# Patient Record
Sex: Female | Born: 1965 | Race: Black or African American | Hispanic: No | Marital: Married | Smoking: Never smoker
Health system: Southern US, Community
[De-identification: ages and names within clinical notes are randomized; demographics above are authoritative.]

## PROBLEM LIST (undated history)

## (undated) DIAGNOSIS — M1990 Unspecified osteoarthritis, unspecified site: Secondary | ICD-10-CM

## (undated) DIAGNOSIS — T304 Corrosion of unspecified body region, unspecified degree: Secondary | ICD-10-CM

## (undated) DIAGNOSIS — G4700 Insomnia, unspecified: Secondary | ICD-10-CM

## (undated) DIAGNOSIS — T25322A Burn of third degree of left foot, initial encounter: Secondary | ICD-10-CM

## (undated) DIAGNOSIS — I10 Essential (primary) hypertension: Secondary | ICD-10-CM

## (undated) HISTORY — DX: Insomnia, unspecified: G47.00

---

## 2006-10-06 ENCOUNTER — Emergency Department (HOSPITAL_COMMUNITY): Admission: EM | Admit: 2006-10-06 | Discharge: 2006-10-06 | Payer: Self-pay | Admitting: Emergency Medicine

## 2008-09-29 IMAGING — CR DG CHEST 1V PORT
1 series · 1 of 1 positions shown · non-contrast
Comparison: none

CLINICAL DATA: Chest pain. 
 PORTABLE CHEST - 1 VIEW - 10/06/06: 
 Taken at 2266 hours.

[view not recorded]
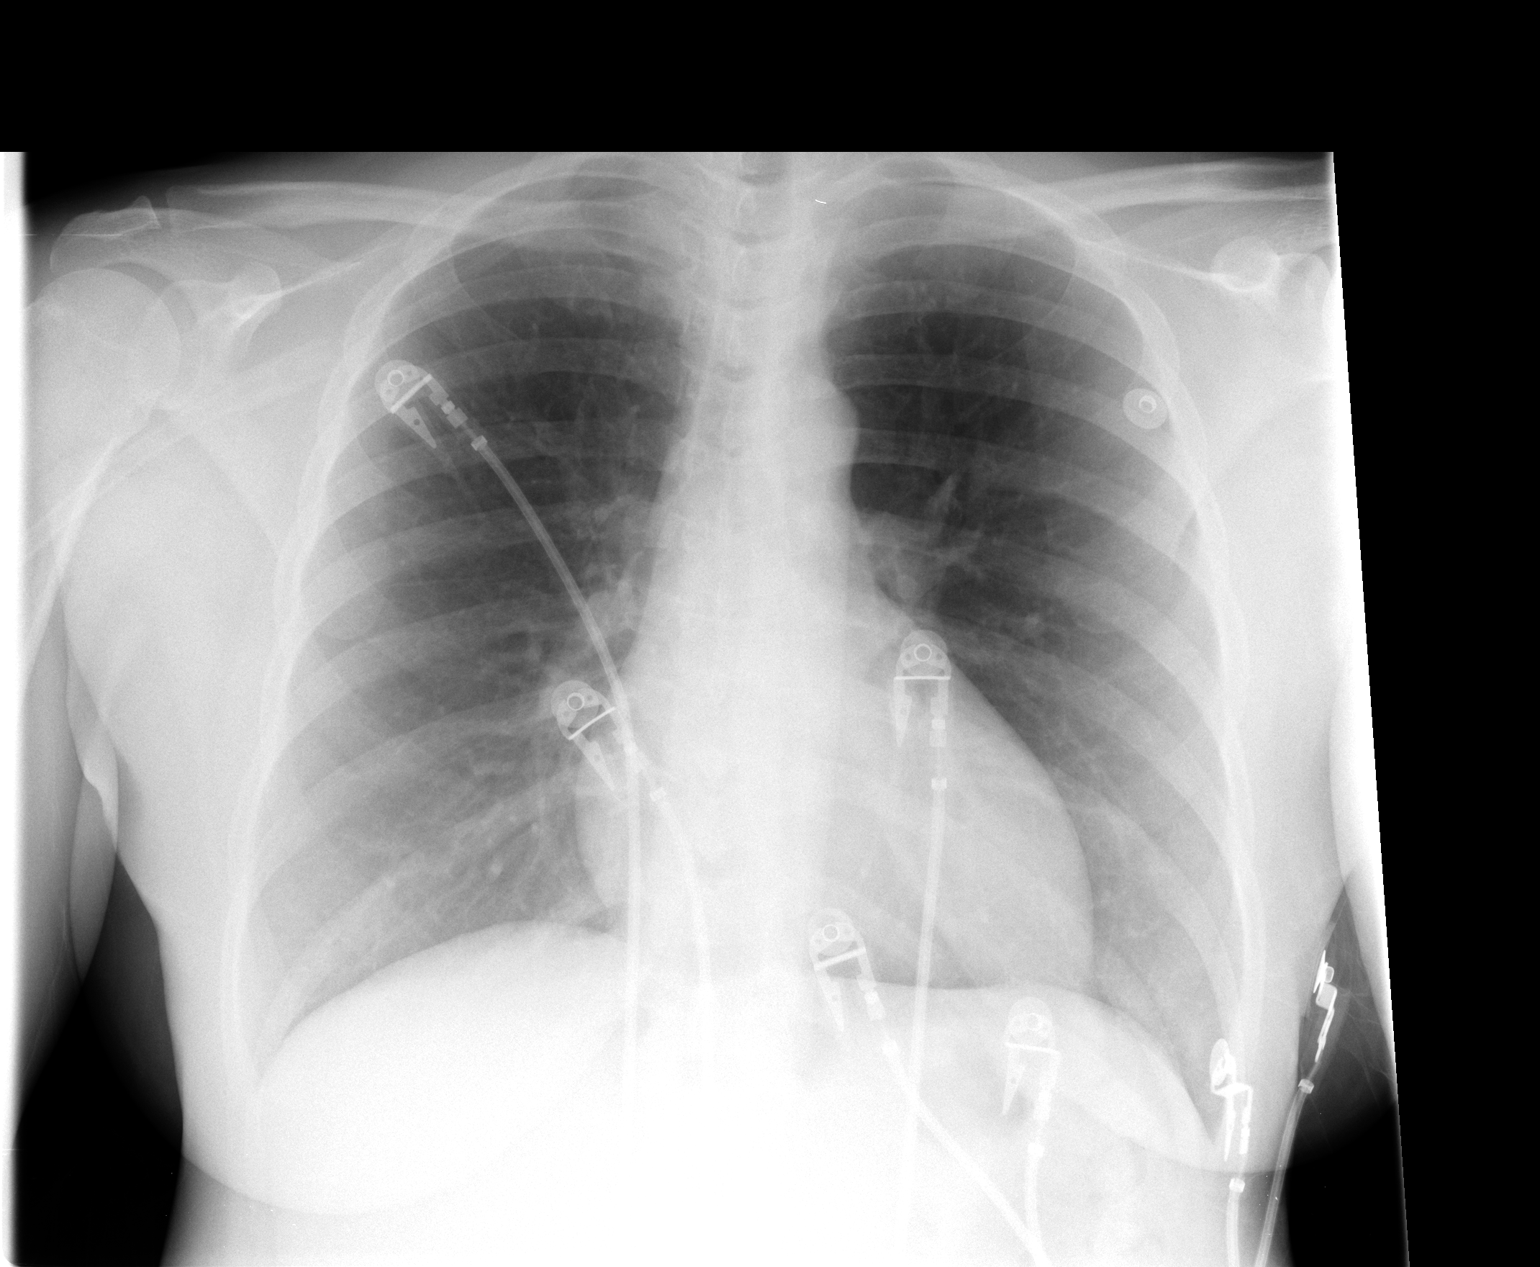

[1 of 1 positions shown; findings below may reference images not displayed]

FINDINGS: The lungs are clear. The heart is upper normal in size for a portable study.  There is no evidence mediastinal or hilar adenopathy.
IMPRESSION: No evidence for active chest disease.

## 2008-12-02 ENCOUNTER — Encounter: Admission: RE | Admit: 2008-12-02 | Discharge: 2008-12-02 | Payer: Self-pay | Admitting: *Deleted

## 2010-10-25 LAB — I-STAT 8, (EC8 V) (CONVERTED LAB)
Acid-Base Excess: 2
Bicarbonate: 28.2 — ABNORMAL HIGH
Glucose, Bld: 96
TCO2: 30
pCO2, Ven: 48.1
pH, Ven: 7.375 — ABNORMAL HIGH

## 2010-10-25 LAB — COMPREHENSIVE METABOLIC PANEL
ALT: 24
Calcium: 8.4
Creatinine, Ser: 0.8
Glucose, Bld: 92
Sodium: 134 — ABNORMAL LOW
Total Protein: 6.5

## 2010-10-25 LAB — DIFFERENTIAL
Eosinophils Absolute: 0.2
Lymphocytes Relative: 28
Lymphs Abs: 2.6
Monocytes Relative: 10
Neutro Abs: 5.3
Neutrophils Relative %: 59

## 2010-10-25 LAB — POCT CARDIAC MARKERS
CKMB, poc: <1 — ABNORMAL LOW
CKMB, poc: <1 — ABNORMAL LOW
Myoglobin, poc: 59.5
Troponin i, poc: <0.05
Troponin i, poc: <0.05

## 2010-10-25 LAB — CBC
Hemoglobin: 10.6 — ABNORMAL LOW
MCHC: 33.1
MCV: 76.4 — ABNORMAL LOW
RDW: 17.4 — ABNORMAL HIGH

## 2010-10-25 LAB — URINALYSIS, ROUTINE W REFLEX MICROSCOPIC
Bilirubin Urine: NEGATIVE
Ketones, ur: NEGATIVE
Nitrite: NEGATIVE
Urobilinogen, UA: 1
pH: 7.5

## 2010-10-25 LAB — POCT I-STAT CREATININE
Creatinine, Ser: 1
Operator id: 277751

## 2011-03-11 DIAGNOSIS — I10 Essential (primary) hypertension: Secondary | ICD-10-CM | POA: Insufficient documentation

## 2011-12-15 ENCOUNTER — Encounter (HOSPITAL_COMMUNITY): Payer: Self-pay | Admitting: Emergency Medicine

## 2011-12-15 ENCOUNTER — Emergency Department (HOSPITAL_COMMUNITY)
Admission: EM | Admit: 2011-12-15 | Discharge: 2011-12-15 | Disposition: A | Discharge status: Home / Self Care | Payer: BC Managed Care – PPO | Attending: Emergency Medicine | Admitting: Emergency Medicine

## 2011-12-15 DIAGNOSIS — Z79899 Other long term (current) drug therapy: Secondary | ICD-10-CM | POA: Insufficient documentation

## 2011-12-15 DIAGNOSIS — G8929 Other chronic pain: Secondary | ICD-10-CM

## 2011-12-15 DIAGNOSIS — I10 Essential (primary) hypertension: Secondary | ICD-10-CM | POA: Insufficient documentation

## 2011-12-15 DIAGNOSIS — M25569 Pain in unspecified knee: Secondary | ICD-10-CM | POA: Insufficient documentation

## 2011-12-15 HISTORY — DX: Essential (primary) hypertension: I10

## 2011-12-15 MED ORDER — HYDROMORPHONE HCL PF 1 MG/ML IJ SOLN
1.0000 mg | Freq: Once | INTRAMUSCULAR | Status: AC
  Administered 2011-12-15: 1 mg via INTRAMUSCULAR
  Filled 2011-12-15: qty 1

## 2011-12-15 MED ORDER — OXYCODONE-ACETAMINOPHEN 5-325 MG PO TABS: 1.0000 | ORAL_TABLET | ORAL | Status: DC | PRN

## 2011-12-15 MED ORDER — PREDNISONE 50 MG PO TABS
60.0000 mg | ORAL_TABLET | Freq: Once | ORAL | Status: AC
  Administered 2011-12-15: 60 mg via ORAL
  Filled 2011-12-15: qty 1

## 2011-12-15 NOTE — ED Notes (Signed)
Pt c/o left thigh pain that radiates to her lower back.

## 2011-12-16 NOTE — ED Provider Notes (Signed)
Medical screening examination/treatment/procedure(s) were performed by non-physician practitioner and as supervising physician I was immediately available for consultation/collaboration.   Britny Riel, MD 12/16/11 1821 

## 2011-12-16 NOTE — ED Provider Notes (Signed)
History     CSN: [id number]  Arrival date & time 12/15/11  1810   First MD Initiated Contact with Patient 12/15/11 1924      Chief Complaint  Patient presents with  . Leg Pain    (Consider location/radiation/quality/duration/timing/severity/associated sxs/prior treatment) HPI Comments: Carla Suarez presents with acute on chronic left knee and left thigh pain which has been present for the past several months, but has become worse today.  She was painting  Interior walls at her rental home when she develop worsening pain in the left knee,  Which radiates into her lateral upper thigh and hip area.  Pain is worse with movement and palpation and weight bearing.  She has seen her orthopedist Dr. Farris Has one month ago for this condition and she reports xrays of her knee and hip were normal.  She is anticipating an mri and possible steroid injections in her knee but this has not been arranged yet.  She denies new injury today but presents for pain relief and an MRI while she is awaiting re-evaluation by Dr Farris Has.  She currently uses voltaren without relief of pain.  She denies numbness, weakness and swelling in her distal leg and foot.  The history is provided by the patient.    Past Medical History  Diagnosis Date  . Hypertension     History reviewed. No pertinent past surgical history.  History reviewed. No pertinent family history.  History  Substance Use Topics  . Smoking status: Never Smoker   . Smokeless tobacco: Not on file  . Alcohol Use: No    OB History    Grav Para Term Preterm Abortions TAB SAB Ect Mult Living                  Review of Systems  Musculoskeletal: Positive for arthralgias. Negative for back pain and joint swelling.  Skin: Negative for wound.  Neurological: Negative for weakness and numbness.    Allergies  Oregano  Home Medications   Current Outpatient Rx  Name  Route  Sig  Dispense  Refill  . AMLODIPINE BESYLATE 10 MG PO TABS   Oral  Take 10 mg by mouth daily.         Marland Kitchen DICLOFENAC SODIUM 75 MG PO TBEC   Oral   Take 75 mg by mouth 2 (two) times daily.         . OXYCODONE-ACETAMINOPHEN 5-325 MG PO TABS   Oral   Take 1 tablet by mouth every 4 (four) hours as needed for pain.   6 tablet   0     BP 169/99  Pulse 75  Temp 98 F (36.7 C) (Oral)  Resp 18  SpO2 98%  Physical Exam  Constitutional: She appears well-developed and well-nourished.  HENT:  Head: Atraumatic.  Neck: Normal range of motion.  Cardiovascular: Intact distal pulses.        Pulses equal bilaterally  Musculoskeletal: She exhibits tenderness. She exhibits no edema.       Left hip: She exhibits normal range of motion, no bony tenderness, no swelling and no deformity.       Left knee: She exhibits normal range of motion, no swelling, no effusion and normal meniscus. tenderness found. Medial joint line and lateral joint line tenderness noted.       Left thigh is soft, anterior tender to palpation, no mass, no muscle spasm, ecchymosis, rash, no edema or visible trauma.   Neurological: She is alert. She has normal strength. She  displays normal reflexes. No sensory deficit.       Equal strength  Skin: Skin is warm and dry. No rash noted.       Skin of left knee and thigh normal,  No erythema, rash, crepitus, no edema.  Psychiatric: She has a normal mood and affect.    ED Course  Procedures (including critical care time)  Labs Reviewed - No data to display No results found.   1. Chronic knee pain       MDM  Explained to pt that ed does not have MRI available at this time.  She was given dilaudid 1 mg IM with moderate improvement in pain.  Exam does not elicit source of pain,  She does have tenderness over her left trochanteric bursa,  But no worse than palpation along her entire left thigh and knee.  Distal pulses intact,  No edema,  With patient reported normal xrays from 1 month ago and no new injury,  Plain xrays deferred.   Also  discussed obtaining bloodwork today as she describes fear of bone cancer.  She states her blood tests last moth by Dr. Farris Has were normal.  Deferred repeat bloodwork today. Chronic nature of pt's complaints make infection unlikely.   Prescribed oxycodone.  Pt plans to see Dr Farris Has tomorrow at walk in clinic for further management.        Burgess Amor, PA 12/16/11 1356  Burgess Amor, [place] 12/16/11 1356

## 2011-12-17 MED FILL — Oxycodone w/ Acetaminophen Tab 5-325 MG: ORAL | Qty: 6 | Status: AC

## 2012-05-11 ENCOUNTER — Emergency Department (HOSPITAL_COMMUNITY)
Admission: EM | Admit: 2012-05-11 | Discharge: 2012-05-11 | Disposition: A | Discharge status: Home / Self Care | Payer: BC Managed Care – PPO | Attending: Emergency Medicine | Admitting: Emergency Medicine

## 2012-05-11 ENCOUNTER — Emergency Department (HOSPITAL_COMMUNITY): Payer: BC Managed Care – PPO

## 2012-05-11 ENCOUNTER — Encounter (HOSPITAL_COMMUNITY): Payer: Self-pay | Admitting: *Deleted

## 2012-05-11 DIAGNOSIS — Z79899 Other long term (current) drug therapy: Secondary | ICD-10-CM | POA: Insufficient documentation

## 2012-05-11 DIAGNOSIS — Y92410 Unspecified street and highway as the place of occurrence of the external cause: Secondary | ICD-10-CM | POA: Insufficient documentation

## 2012-05-11 DIAGNOSIS — S20211A Contusion of right front wall of thorax, initial encounter: Secondary | ICD-10-CM

## 2012-05-11 DIAGNOSIS — S20219A Contusion of unspecified front wall of thorax, initial encounter: Secondary | ICD-10-CM | POA: Insufficient documentation

## 2012-05-11 DIAGNOSIS — T07XXXA Unspecified multiple injuries, initial encounter: Secondary | ICD-10-CM | POA: Insufficient documentation

## 2012-05-11 DIAGNOSIS — I10 Essential (primary) hypertension: Secondary | ICD-10-CM | POA: Insufficient documentation

## 2012-05-11 DIAGNOSIS — Y9389 Activity, other specified: Secondary | ICD-10-CM | POA: Insufficient documentation

## 2012-05-11 MED ORDER — HYDROCODONE-ACETAMINOPHEN 5-325 MG PO TABS: 1.0000 | ORAL_TABLET | ORAL | Status: DC | PRN

## 2012-05-11 MED ORDER — HYDROCODONE-ACETAMINOPHEN 5-325 MG PO TABS
1.0000 | ORAL_TABLET | Freq: Once | ORAL | Status: AC
  Administered 2012-05-11: 1 via ORAL
  Filled 2012-05-11: qty 1

## 2012-05-11 NOTE — ED Provider Notes (Signed)
Medical screening examination/treatment/procedure(s) were performed by non-physician practitioner and as supervising physician I was immediately available for consultation/collaboration.   Charles B. Sheldon, MD 05/11/12 2259 
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
## 2014-07-11 ENCOUNTER — Encounter (INDEPENDENT_AMBULATORY_CARE_PROVIDER_SITE_OTHER): Payer: Self-pay | Admitting: *Deleted

## 2014-11-03 ENCOUNTER — Encounter (INDEPENDENT_AMBULATORY_CARE_PROVIDER_SITE_OTHER): Payer: Self-pay | Admitting: *Deleted

## 2014-11-03 ENCOUNTER — Other Ambulatory Visit (INDEPENDENT_AMBULATORY_CARE_PROVIDER_SITE_OTHER): Payer: Self-pay | Admitting: *Deleted

## 2014-11-03 DIAGNOSIS — Z1211 Encounter for screening for malignant neoplasm of colon: Secondary | ICD-10-CM

## 2014-12-21 ENCOUNTER — Telehealth (INDEPENDENT_AMBULATORY_CARE_PROVIDER_SITE_OTHER): Payer: Self-pay | Admitting: *Deleted

## 2014-12-21 DIAGNOSIS — Z1211 Encounter for screening for malignant neoplasm of colon: Secondary | ICD-10-CM

## 2014-12-21 MED ORDER — PEG 3350-KCL-NA BICARB-NACL 420 G PO SOLR: 4000.0000 mL | Freq: Once | ORAL | Status: DC

## 2014-12-21 NOTE — Telephone Encounter (Signed)
Patient needs trilyte 

## 2015-01-17 ENCOUNTER — Telehealth (INDEPENDENT_AMBULATORY_CARE_PROVIDER_SITE_OTHER): Payer: Self-pay | Admitting: *Deleted

## 2015-01-17 NOTE — Telephone Encounter (Signed)
agree

## 2015-01-17 NOTE — Telephone Encounter (Signed)
Referring MD/PCP: howell   Procedure: tcs  Reason/Indication:  screening  Has patient had this procedure before?  yes  If so, when, by whom and where?    Is there a family history of colon cancer?  no  Who?  What age when diagnosed?    Is patient diabetic?   no      Does patient have prosthetic heart valve or mechanical valve?  no  Do you have a pacemaker?  no  Has patient ever had endocarditis? no  Has patient had joint replacement within last 12 months?  no  Does patient tend to be constipated or take laxatives? no  Does patient have a history of alcohol/drug use?  no  Is patient on Coumadin, Plavix and/or Aspirin? no  Medications: amlodipine 10/5 mg daily, otc arthritis 600 mg prn  Allergies: nkda  Medication Adjustment:   Procedure date & time: 1/18/7 at 830

## 2015-02-01 ENCOUNTER — Encounter (HOSPITAL_COMMUNITY): Payer: Self-pay | Admitting: *Deleted

## 2015-02-01 ENCOUNTER — Encounter (HOSPITAL_COMMUNITY)
Admission: RE | Disposition: A | Discharge status: Home / Self Care | Payer: Self-pay | Source: Ambulatory Visit | Attending: Internal Medicine

## 2015-02-01 ENCOUNTER — Ambulatory Visit (HOSPITAL_COMMUNITY)
Admission: RE | Admit: 2015-02-01 | Discharge: 2015-02-01 | Disposition: A | Discharge status: Home / Self Care | Payer: TRICARE For Life (TFL) | Source: Ambulatory Visit | Attending: Internal Medicine | Admitting: Internal Medicine

## 2015-02-01 DIAGNOSIS — Z1211 Encounter for screening for malignant neoplasm of colon: Secondary | ICD-10-CM | POA: Diagnosis not present

## 2015-02-01 DIAGNOSIS — I10 Essential (primary) hypertension: Secondary | ICD-10-CM | POA: Diagnosis not present

## 2015-02-01 DIAGNOSIS — Z79899 Other long term (current) drug therapy: Secondary | ICD-10-CM | POA: Diagnosis not present

## 2015-02-01 DIAGNOSIS — K644 Residual hemorrhoidal skin tags: Secondary | ICD-10-CM | POA: Insufficient documentation

## 2015-02-01 DIAGNOSIS — K648 Other hemorrhoids: Secondary | ICD-10-CM | POA: Diagnosis not present

## 2015-02-01 DIAGNOSIS — K5730 Diverticulosis of large intestine without perforation or abscess without bleeding: Secondary | ICD-10-CM | POA: Insufficient documentation

## 2015-02-01 HISTORY — PX: COLONOSCOPY: SHX5424

## 2015-02-01 SURGERY — COLONOSCOPY
Anesthesia: Moderate Sedation

## 2015-02-01 MED ORDER — MEPERIDINE HCL 50 MG/ML IJ SOLN
INTRAMUSCULAR | Status: AC
  Filled 2015-02-01: qty 1

## 2015-02-01 MED ORDER — MEPERIDINE HCL 50 MG/ML IJ SOLN
INTRAMUSCULAR | Status: DC | PRN
  Administered 2015-02-01 (×2): 25 mg via INTRAVENOUS

## 2015-02-01 MED ORDER — MIDAZOLAM HCL 5 MG/5ML IJ SOLN
INTRAMUSCULAR | Status: AC
  Filled 2015-02-01: qty 10

## 2015-02-01 MED ORDER — SODIUM CHLORIDE 0.9 % IV SOLN
INTRAVENOUS | Status: DC
  Administered 2015-02-01: 08:00:00 via INTRAVENOUS

## 2015-02-01 MED ORDER — STERILE WATER FOR IRRIGATION IR SOLN
Status: DC | PRN
  Administered 2015-02-01: 09:00:00

## 2015-02-01 MED ORDER — MIDAZOLAM HCL 5 MG/5ML IJ SOLN
INTRAMUSCULAR | Status: DC | PRN
  Administered 2015-02-01 (×2): 2 mg via INTRAVENOUS
  Administered 2015-02-01: 1 mg via INTRAVENOUS

## 2015-02-01 NOTE — Discharge Instructions (Signed)
Resume usual medications and diet  No driving for 24 hours  Next screening exam in 10 years     Colonoscopy, Care After These instructions give you information on caring for yourself after your procedure. Your doctor may also give you more specific instructions. Call your doctor if you have any problems or questions after your procedure. HOME CARE  Do not drive for 24 hours.  Do not sign important papers or use machinery for 24 hours.  You may shower.  You may go back to your usual activities, but go slower for the first 24 hours.  Take rest breaks often during the first 24 hours.  Walk around or use warm packs on your belly (abdomen) if you have belly cramping or gas.  Drink enough fluids to keep your pee (urine) clear or pale yellow.  Resume your normal diet. Avoid heavy or fried foods.  Avoid drinking alcohol for 24 hours or as told by your doctor.  Only take medicines as told by your doctor. If a tissue sample (biopsy) was taken during the procedure:   Do not take aspirin or blood thinners for 7 days, or as told by your doctor.  Do not drink alcohol for 7 days, or as told by your doctor.  Eat soft foods for the first 24 hours. GET HELP IF: You still have a small amount of blood in your poop (stool) 2-3 days after the procedure. GET HELP RIGHT AWAY IF:  You have more than a small amount of blood in your poop.  You see clumps of tissue (blood clots) in your poop.  Your belly is puffy (swollen).  You feel sick to your stomach (nauseous) or throw up (vomit).  You have a fever.  You have belly pain that gets worse and medicine does not help. MAKE SURE YOU:  Understand these instructions.  Will watch your condition.  Will get help right away if you are not doing well or get worse.   This information is not intended to replace advice given to you by your health care provider. Make sure you discuss any questions you have with your health care provider.     Document Released: 02/02/2010 Document Revised: 01/05/2013 Document Reviewed: 09/07/2012 Elsevier Interactive Patient Education 2016 [employer].   Hemorrhoids Hemorrhoids are swollen veins around the rectum or anus. There are two types of hemorrhoids:   Internal hemorrhoids. These occur in the veins just inside the rectum. They may poke through to the outside and become irritated and painful.  External hemorrhoids. These occur in the veins outside the anus and can be felt as a painful swelling or hard lump near the anus. CAUSES  Pregnancy.   Obesity.   Constipation or diarrhea.   Straining to have a bowel movement.   Sitting for long periods on the toilet.  Heavy lifting or other activity that caused you to strain.  Anal intercourse. SYMPTOMS   Pain.   Anal itching or irritation.   Rectal bleeding.   Fecal leakage.   Anal swelling.   One or more lumps around the anus.  DIAGNOSIS  Your caregiver may be able to diagnose hemorrhoids by visual examination. Other examinations or tests that may be performed include:   Examination of the rectal area with a gloved hand (digital rectal exam).   Examination of anal canal using a small tube (scope).   A blood test if you have lost a significant amount of blood.  A test to look inside the colon (sigmoidoscopy  or colonoscopy). TREATMENT Most hemorrhoids can be treated at home. However, if symptoms do not seem to be getting better or if you have a lot of rectal bleeding, your caregiver may perform a procedure to help make the hemorrhoids get smaller or remove them completely. Possible treatments include:   Placing a rubber band at the base of the hemorrhoid to cut off the circulation (rubber band ligation).   Injecting a chemical to shrink the hemorrhoid (sclerotherapy).   Using a tool to burn the hemorrhoid (infrared light therapy).   Surgically removing the hemorrhoid (hemorrhoidectomy).   Stapling  the hemorrhoid to block blood flow to the tissue (hemorrhoid stapling).  HOME CARE INSTRUCTIONS   Eat foods with fiber, such as whole grains, beans, nuts, fruits, and vegetables. Ask your doctor about taking products with added fiber in them (fibersupplements).  Increase fluid intake. Drink enough water and fluids to keep your urine clear or pale yellow.   Exercise regularly.   Go to the bathroom when you have the urge to have a bowel movement. Do not wait.   Avoid straining to have bowel movements.   Keep the anal area dry and clean. Use wet toilet paper or moist towelettes after a bowel movement.   Medicated creams and suppositories may be used or applied as directed.   Only take over-the-counter or prescription medicines as directed by your caregiver.   Take warm sitz baths for 15-20 minutes, 3-4 times a day to ease pain and discomfort.   Place ice packs on the hemorrhoids if they are tender and swollen. Using ice packs between sitz baths may be helpful.   Put ice in a plastic bag.   Place a towel between your skin and the bag.   Leave the ice on for 15-20 minutes, 3-4 times a day.   Do not use a donut-shaped pillow or sit on the toilet for long periods. This increases blood pooling and pain.  SEEK MEDICAL CARE IF:  You have increasing pain and swelling that is not controlled by treatment or medicine.  You have uncontrolled bleeding.  You have difficulty or you are unable to have a bowel movement.  You have pain or inflammation outside the area of the hemorrhoids. MAKE SURE YOU:  Understand these instructions.  Will watch your condition.  Will get help right away if you are not doing well or get worse.   This information is not intended to replace advice given to you by your health care provider. Make sure you discuss any questions you have with your health care provider.   Document Released: 12/29/1999 Document Revised: 12/18/2011 Document Reviewed:  11/05/2011 Elsevier Interactive Patient Education 2016 [employer].    Diverticulosis Diverticulosis is the condition that develops when small pouches (diverticula) form in the wall of your colon. Your colon, or large intestine, is where water is absorbed and stool is formed. The pouches form when the inside layer of your colon pushes through weak spots in the outer layers of your colon. CAUSES  No one knows exactly what causes diverticulosis. RISK FACTORS  Being older than 50. Your risk for this condition increases with age. Diverticulosis is rare in people younger than 40 years. By age 44, almost everyone has it.  Eating a low-fiber diet.  Being frequently constipated.  Being overweight.  Not getting enough exercise.  Smoking.  Taking over-the-counter pain medicines, like aspirin and ibuprofen. SYMPTOMS  Most people with diverticulosis do not have symptoms. DIAGNOSIS  Because diverticulosis often has  no symptoms, health care providers often discover the condition during an exam for other colon problems. In many cases, a health care provider will diagnose diverticulosis while using a flexible scope to examine the colon (colonoscopy). TREATMENT  If you have never developed an infection related to diverticulosis, you may not need treatment. If you have had an infection before, treatment may include:  Eating more fruits, vegetables, and grains.  Taking a fiber supplement.  Taking a live bacteria supplement (probiotic).  Taking medicine to relax your colon. HOME CARE INSTRUCTIONS   Drink at least 6-8 glasses of water each day to prevent constipation.  Try not to strain when you have a bowel movement.  Keep all follow-up appointments. If you have had an infection before:  Increase the fiber in your diet as directed by your health care provider or dietitian.  Take a dietary fiber supplement if your health care provider approves.  Only take medicines as directed by  your health care provider. SEEK MEDICAL CARE IF:   You have abdominal pain.  You have bloating.  You have cramps.  You have not gone to the bathroom in 3 days. SEEK IMMEDIATE MEDICAL CARE IF:   Your pain gets worse.  Yourbloating becomes very bad.  You have a fever or chills, and your symptoms suddenly get worse.  You begin vomiting.  You have bowel movements that are bloody or black. MAKE SURE YOU:  Understand these instructions.  Will watch your condition.  Will get help right away if you are not doing well or get worse.   This information is not intended to replace advice given to you by your health care provider. Make sure you discuss any questions you have with your health care provider.   Document Released: 09/28/2003 Document Revised: 01/05/2013 Document Reviewed: 11/25/2012 Elsevier Interactive Patient Education [employer]

## 2015-02-01 NOTE — H&P (Signed)
Carla Suarez is an 50 y.o. female.   Chief Complaint:  Patient is here for colonoscopy. HPI:  Asian 50 year old American female who is here for screening colonoscopy. She denies abdominal pain change in bowel habits or rectal bleeding.  Family history is negative for CRC.  Past Medical History  Diagnosis Date  . Hypertension     Past Surgical History  Procedure Laterality Date  . No past surgeries      History reviewed. No pertinent family history. Social History:  reports that she has never smoked. She does not have any smokeless tobacco history on file. She reports that she drinks about 1.8 oz of alcohol per week. She reports that she does not use illicit drugs.  Allergies:  Allergies  Allergen Reactions  . Oregano [Origanum Oil] Swelling    Swelling of the tongue    Medications Prior to Admission  Medication Sig Dispense Refill  . amLODipine-benazepril (LOTREL) 5-10 MG capsule Take 1 capsule by mouth daily.    . polyethylene glycol-electrolytes (NULYTELY/GOLYTELY) 420 G solution Take 4,000 mLs by mouth once. 4000 mL 0  . diphenhydramine-acetaminophen (TYLENOL PM) 25-500 MG TABS tablet Take 1 tablet by mouth at bedtime as needed (sleep).      No results found for this or any previous visit (from the past 48 hour(s)). No results found.  ROS  Blood pressure 130/87, pulse 67, temperature 98.3 F (36.8 C), temperature source Oral, resp. rate 18, height 5' 3.5" (1.613 m), weight 121 lb (54.885 kg), SpO2 100 %. Physical Exam  Constitutional: She appears well-developed and well-nourished.  HENT:  Mouth/Throat: Oropharynx is clear and moist.  Eyes: Conjunctivae are normal. No scleral icterus.  Neck: No thyromegaly present.  Cardiovascular: Normal rate, regular rhythm and normal heart sounds.   No murmur heard. Respiratory: Effort normal and breath sounds normal.  GI: Soft. She exhibits no distension and no mass. There is no tenderness.  Musculoskeletal: She exhibits no  edema.  Lymphadenopathy:    She has no cervical adenopathy.  Neurological: She is alert.  Skin: Skin is warm and dry.     Assessment/Plan Average risk screening colonoscopy.  REHMAN,NAJEEB U 02/01/2015, 8:21 AM

## 2015-02-01 NOTE — Op Note (Addendum)
COLONOSCOPY PROCEDURE REPORT  PATIENT:  Rakeb Kibble  MR#:  [id number] Birthdate:  06/17/1965, 50 y.o., female Endoscopist:  Dr. Malissa Hippo, MD Referred By:  Dr. Devra Dopp, M.D.  Procedure Date: 02/01/2015  Procedure:   Colonoscopy  Indications: Average risk screening colonoscopy.  Informed Consent:  The procedure and risks were reviewed with the patient and informed consent was obtained.  Medications:  Demerol 50 mg IV Versed 5 mg IV  First dose administered at 8:37 AM Last dose administered at 8:45 AM, Scope out 8:54 AM.  Description of procedure:  After a digital rectal exam was performed, that colonoscope was advanced from the anus through the rectum and colon to the area of the cecum, ileocecal valve and appendiceal orifice. The cecum was deeply intubated. These structures were well-seen and photographed for the record. From the level of the cecum and ileocecal valve, the scope was slowly and cautiously withdrawn. The mucosal surfaces were carefully surveyed utilizing scope tip to flexion to facilitate fold flattening as needed. The scope was pulled down into the rectum where a thorough exam including retroflexion was performed.  Findings:   Prep excellent. Single diverticulum noted at hepatic flexure. Mucosa of rest of the colon and rectum was normal. Small hemorrhoids below the dentate line.    Therapeutic/Diagnostic Maneuvers Performed:   None  Complications: None  EBL: None  Cecal Withdrawal Time:  7 minutes  Impression:  Normal colonoscopy except single small diverticulum at hepatic flexure and external hemorrhoids.   Recommendations:  Standard instructions given. Next screening exam in 10 years.  Jedd Schulenburg U  02/01/2015 9:14 AM  CC: Dr. Devra Dopp, MD & Dr. Bonnetta Barry ref. provider found

## 2015-02-02 ENCOUNTER — Encounter (HOSPITAL_COMMUNITY): Payer: Self-pay | Admitting: Internal Medicine

## 2015-03-02 ENCOUNTER — Other Ambulatory Visit (HOSPITAL_COMMUNITY): Payer: Self-pay | Admitting: Family Medicine

## 2015-03-02 DIAGNOSIS — Z1231 Encounter for screening mammogram for malignant neoplasm of breast: Secondary | ICD-10-CM

## 2015-03-06 ENCOUNTER — Ambulatory Visit (HOSPITAL_COMMUNITY)
Admission: RE | Admit: 2015-03-06 | Discharge: 2015-03-06 | Disposition: A | Discharge status: Home / Self Care | Payer: TRICARE For Life (TFL) | Source: Ambulatory Visit | Attending: Family Medicine | Admitting: Family Medicine

## 2015-03-06 DIAGNOSIS — Z1231 Encounter for screening mammogram for malignant neoplasm of breast: Secondary | ICD-10-CM | POA: Diagnosis not present

## 2016-02-29 DIAGNOSIS — T25322A Burn of third degree of left foot, initial encounter: Secondary | ICD-10-CM

## 2016-02-29 HISTORY — DX: Burn of third degree of left foot, initial encounter: T25.322A

## 2016-03-02 ENCOUNTER — Encounter (HOSPITAL_COMMUNITY): Payer: Self-pay | Admitting: Emergency Medicine

## 2016-03-02 ENCOUNTER — Emergency Department (HOSPITAL_COMMUNITY)
Admission: EM | Admit: 2016-03-02 | Discharge: 2016-03-02 | Disposition: A | Discharge status: Home / Self Care | Attending: Emergency Medicine | Admitting: Emergency Medicine

## 2016-03-02 DIAGNOSIS — T25222A Burn of second degree of left foot, initial encounter: Secondary | ICD-10-CM | POA: Diagnosis present

## 2016-03-02 DIAGNOSIS — Y929 Unspecified place or not applicable: Secondary | ICD-10-CM | POA: Insufficient documentation

## 2016-03-02 DIAGNOSIS — X12XXXA Contact with other hot fluids, initial encounter: Secondary | ICD-10-CM | POA: Diagnosis not present

## 2016-03-02 DIAGNOSIS — Y999 Unspecified external cause status: Secondary | ICD-10-CM | POA: Diagnosis not present

## 2016-03-02 DIAGNOSIS — I10 Essential (primary) hypertension: Secondary | ICD-10-CM | POA: Diagnosis not present

## 2016-03-02 DIAGNOSIS — Y939 Activity, unspecified: Secondary | ICD-10-CM | POA: Diagnosis not present

## 2016-03-02 MED ORDER — SILVER SULFADIAZINE 1 % EX CREA: 1.0000 "application " | TOPICAL_CREAM | Freq: Every day | CUTANEOUS | 0 refills | Status: DC

## 2016-03-02 MED ORDER — HYDROCODONE-ACETAMINOPHEN 5-325 MG PO TABS: 1.0000 | ORAL_TABLET | Freq: Four times a day (QID) | ORAL | 0 refills | Status: DC | PRN

## 2016-03-02 MED ORDER — IBUPROFEN 400 MG PO TABS
600.0000 mg | ORAL_TABLET | Freq: Once | ORAL | Status: AC
  Administered 2016-03-02: 600 mg via ORAL
  Filled 2016-03-02: qty 2

## 2016-03-02 NOTE — ED Notes (Signed)
Pt made aware to return if symptoms worsen or if any life threatening symptoms occur.   
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
## 2016-03-07 ENCOUNTER — Encounter (HOSPITAL_COMMUNITY): Payer: Self-pay | Admitting: Emergency Medicine

## 2016-03-07 ENCOUNTER — Emergency Department (HOSPITAL_COMMUNITY)
Admission: EM | Admit: 2016-03-07 | Discharge: 2016-03-07 | Disposition: A | Discharge status: Home / Self Care | Attending: Emergency Medicine | Admitting: Emergency Medicine

## 2016-03-07 DIAGNOSIS — Y999 Unspecified external cause status: Secondary | ICD-10-CM | POA: Insufficient documentation

## 2016-03-07 DIAGNOSIS — Z79899 Other long term (current) drug therapy: Secondary | ICD-10-CM | POA: Insufficient documentation

## 2016-03-07 DIAGNOSIS — Z5189 Encounter for other specified aftercare: Secondary | ICD-10-CM

## 2016-03-07 DIAGNOSIS — Y939 Activity, unspecified: Secondary | ICD-10-CM | POA: Insufficient documentation

## 2016-03-07 DIAGNOSIS — I10 Essential (primary) hypertension: Secondary | ICD-10-CM | POA: Insufficient documentation

## 2016-03-07 DIAGNOSIS — X12XXXD Contact with other hot fluids, subsequent encounter: Secondary | ICD-10-CM | POA: Diagnosis not present

## 2016-03-07 DIAGNOSIS — T25122D Burn of first degree of left foot, subsequent encounter: Secondary | ICD-10-CM | POA: Diagnosis not present

## 2016-03-07 DIAGNOSIS — Z4800 Encounter for change or removal of nonsurgical wound dressing: Secondary | ICD-10-CM | POA: Diagnosis present

## 2016-03-07 DIAGNOSIS — Y929 Unspecified place or not applicable: Secondary | ICD-10-CM | POA: Insufficient documentation

## 2016-03-07 LAB — CBC WITH DIFFERENTIAL/PLATELET
BASOS ABS: 0 10*3/uL (ref 0.0–0.1)
BASOS PCT: 0 %
Eosinophils Absolute: 0.2 10*3/uL (ref 0.0–0.7)
Eosinophils Relative: 3 %
HEMATOCRIT: 32.4 % — AB (ref 36.0–46.0)
Hemoglobin: 10.9 g/dL — ABNORMAL LOW (ref 12.0–15.0)
Lymphocytes Relative: 20 %
Lymphs Abs: 1.4 10*3/uL (ref 0.7–4.0)
MCH: 27.9 pg (ref 26.0–34.0)
MCHC: 33.6 g/dL (ref 30.0–36.0)
MCV: 83.1 fL (ref 78.0–100.0)
MONO ABS: 0.9 10*3/uL (ref 0.1–1.0)
Monocytes Relative: 13 %
NEUTROS ABS: 4.4 10*3/uL (ref 1.7–7.7)
Neutrophils Relative %: 64 %
Platelets: 311 10*3/uL (ref 150–400)
RBC: 3.9 MIL/uL (ref 3.87–5.11)
RDW: 15.6 % — AB (ref 11.5–15.5)
WBC: 6.9 10*3/uL (ref 4.0–10.5)

## 2016-03-07 LAB — BASIC METABOLIC PANEL
ANION GAP: 11 (ref 5–15)
BUN: 36 mg/dL — ABNORMAL HIGH (ref 6–20)
CALCIUM: 8.9 mg/dL (ref 8.9–10.3)
CO2: 25 mmol/L (ref 22–32)
Chloride: 101 mmol/L (ref 101–111)
Creatinine, Ser: 1.61 mg/dL — ABNORMAL HIGH (ref 0.44–1.00)
GFR calc Af Amer: 41 mL/min — ABNORMAL LOW (ref >60)
GFR, EST NON AFRICAN AMERICAN: 36 mL/min — AB (ref >60)
GLUCOSE: 91 mg/dL (ref 65–99)
Potassium: 4 mmol/L (ref 3.5–5.1)
SODIUM: 137 mmol/L (ref 135–145)

## 2016-03-07 MED ORDER — CLINDAMYCIN HCL 300 MG PO CAPS: 300.0000 mg | ORAL_CAPSULE | Freq: Four times a day (QID) | ORAL | 0 refills | Status: DC

## 2016-03-07 MED ORDER — SILVER SULFADIAZINE 1 % EX CREA: 1.0000 "application " | TOPICAL_CREAM | Freq: Every day | CUTANEOUS | 0 refills | Status: DC

## 2016-03-07 MED ORDER — HYDROCODONE-ACETAMINOPHEN 5-325 MG PO TABS
1.0000 | ORAL_TABLET | Freq: Once | ORAL | Status: AC
  Administered 2016-03-07: 1 via ORAL
  Filled 2016-03-07: qty 1

## 2016-03-07 MED ORDER — HYDROCODONE-ACETAMINOPHEN 5-325 MG PO TABS: 1.0000 | ORAL_TABLET | ORAL | 0 refills | Status: DC | PRN

## 2016-03-07 MED ORDER — CLINDAMYCIN PHOSPHATE 600 MG/50ML IV SOLN
600.0000 mg | Freq: Once | INTRAVENOUS | Status: AC
  Administered 2016-03-07: 600 mg via INTRAVENOUS
  Filled 2016-03-07: qty 50
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
## 2016-03-07 NOTE — Discharge Instructions (Signed)
Continue using the silvadene twice daily after gentle washing of the wound. Keep elevated as much as possible.  Start taking the antibiotic prescribed.
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
## 2016-03-12 DIAGNOSIS — T25322A Burn of third degree of left foot, initial encounter: Secondary | ICD-10-CM | POA: Insufficient documentation

## 2016-03-14 ENCOUNTER — Encounter (HOSPITAL_BASED_OUTPATIENT_CLINIC_OR_DEPARTMENT_OTHER): Payer: Self-pay | Admitting: *Deleted

## 2016-03-14 DIAGNOSIS — T304 Corrosion of unspecified body region, unspecified degree: Secondary | ICD-10-CM

## 2016-03-14 HISTORY — DX: Corrosion of unspecified body region, unspecified degree: T30.4

## 2016-03-14 NOTE — Pre-Procedure Instructions (Signed)
[facility]

## 2016-03-18 ENCOUNTER — Ambulatory Visit: Payer: Self-pay | Admitting: Plastic Surgery

## 2016-03-18 DIAGNOSIS — T25322A Burn of third degree of left foot, initial encounter: Secondary | ICD-10-CM

## 2016-03-19 ENCOUNTER — Encounter (HOSPITAL_COMMUNITY)
Admission: RE | Admit: 2016-03-19 | Discharge: 2016-03-19 | Disposition: A | Discharge status: Home / Self Care | Source: Ambulatory Visit | Attending: Plastic Surgery | Admitting: Plastic Surgery

## 2016-03-19 DIAGNOSIS — M1712 Unilateral primary osteoarthritis, left knee: Secondary | ICD-10-CM | POA: Diagnosis not present

## 2016-03-19 DIAGNOSIS — Z833 Family history of diabetes mellitus: Secondary | ICD-10-CM | POA: Diagnosis not present

## 2016-03-19 DIAGNOSIS — Z9889 Other specified postprocedural states: Secondary | ICD-10-CM | POA: Diagnosis not present

## 2016-03-19 DIAGNOSIS — T799XXA Unspecified early complication of trauma, initial encounter: Secondary | ICD-10-CM | POA: Diagnosis not present

## 2016-03-19 DIAGNOSIS — T25022A Burn of unspecified degree of left foot, initial encounter: Secondary | ICD-10-CM | POA: Diagnosis present

## 2016-03-19 DIAGNOSIS — Z79899 Other long term (current) drug therapy: Secondary | ICD-10-CM | POA: Diagnosis not present

## 2016-03-19 DIAGNOSIS — I10 Essential (primary) hypertension: Secondary | ICD-10-CM | POA: Diagnosis not present

## 2016-03-19 DIAGNOSIS — T310 Burns involving less than 10% of body surface: Secondary | ICD-10-CM | POA: Diagnosis not present

## 2016-03-19 DIAGNOSIS — T25322A Burn of third degree of left foot, initial encounter: Secondary | ICD-10-CM

## 2016-03-19 DIAGNOSIS — Z8249 Family history of ischemic heart disease and other diseases of the circulatory system: Secondary | ICD-10-CM | POA: Diagnosis not present

## 2016-03-19 DIAGNOSIS — Z91018 Allergy to other foods: Secondary | ICD-10-CM | POA: Diagnosis not present

## 2016-03-19 DIAGNOSIS — X12XXXA Contact with other hot fluids, initial encounter: Secondary | ICD-10-CM | POA: Diagnosis not present

## 2016-03-21 ENCOUNTER — Ambulatory Visit (HOSPITAL_BASED_OUTPATIENT_CLINIC_OR_DEPARTMENT_OTHER)
Admission: RE | Admit: 2016-03-21 | Discharge: 2016-03-21 | Disposition: A | Discharge status: Home / Self Care | Source: Ambulatory Visit | Attending: Plastic Surgery | Admitting: Plastic Surgery

## 2016-03-21 ENCOUNTER — Encounter (HOSPITAL_BASED_OUTPATIENT_CLINIC_OR_DEPARTMENT_OTHER)
Admission: RE | Disposition: A | Discharge status: Home / Self Care | Payer: Self-pay | Source: Ambulatory Visit | Attending: Plastic Surgery

## 2016-03-21 ENCOUNTER — Encounter (HOSPITAL_BASED_OUTPATIENT_CLINIC_OR_DEPARTMENT_OTHER): Payer: Self-pay | Admitting: Plastic Surgery

## 2016-03-21 ENCOUNTER — Ambulatory Visit (HOSPITAL_BASED_OUTPATIENT_CLINIC_OR_DEPARTMENT_OTHER): Admitting: Anesthesiology

## 2016-03-21 DIAGNOSIS — T25322A Burn of third degree of left foot, initial encounter: Secondary | ICD-10-CM | POA: Diagnosis not present

## 2016-03-21 DIAGNOSIS — Z833 Family history of diabetes mellitus: Secondary | ICD-10-CM | POA: Insufficient documentation

## 2016-03-21 DIAGNOSIS — I10 Essential (primary) hypertension: Secondary | ICD-10-CM | POA: Insufficient documentation

## 2016-03-21 DIAGNOSIS — Z79899 Other long term (current) drug therapy: Secondary | ICD-10-CM | POA: Insufficient documentation

## 2016-03-21 DIAGNOSIS — Z91018 Allergy to other foods: Secondary | ICD-10-CM | POA: Insufficient documentation

## 2016-03-21 DIAGNOSIS — X12XXXA Contact with other hot fluids, initial encounter: Secondary | ICD-10-CM | POA: Insufficient documentation

## 2016-03-21 DIAGNOSIS — T310 Burns involving less than 10% of body surface: Secondary | ICD-10-CM | POA: Insufficient documentation

## 2016-03-21 DIAGNOSIS — M1712 Unilateral primary osteoarthritis, left knee: Secondary | ICD-10-CM | POA: Insufficient documentation

## 2016-03-21 DIAGNOSIS — T799XXA Unspecified early complication of trauma, initial encounter: Secondary | ICD-10-CM | POA: Insufficient documentation

## 2016-03-21 DIAGNOSIS — Z8249 Family history of ischemic heart disease and other diseases of the circulatory system: Secondary | ICD-10-CM | POA: Insufficient documentation

## 2016-03-21 DIAGNOSIS — Z9889 Other specified postprocedural states: Secondary | ICD-10-CM | POA: Insufficient documentation

## 2016-03-21 HISTORY — DX: Burn of third degree of left foot, initial encounter: T25.322A

## 2016-03-21 HISTORY — DX: Corrosion of unspecified body region, unspecified degree: T30.4

## 2016-03-21 HISTORY — PX: I&D EXTREMITY: SHX5045

## 2016-03-21 HISTORY — PX: APPLICATION OF A-CELL OF EXTREMITY: SHX6303

## 2016-03-21 HISTORY — DX: Unspecified osteoarthritis, unspecified site: M19.90

## 2016-03-21 SURGERY — IRRIGATION AND DEBRIDEMENT EXTREMITY
Anesthesia: General | Laterality: Left

## 2016-03-21 MED ORDER — FENTANYL CITRATE (PF) 100 MCG/2ML IJ SOLN
50.0000 ug | INTRAMUSCULAR | Status: AC | PRN
  Administered 2016-03-21: 50 ug via INTRAVENOUS
  Administered 2016-03-21: 100 ug via INTRAVENOUS
  Administered 2016-03-21: 50 ug via INTRAVENOUS

## 2016-03-21 MED ORDER — PHENYLEPHRINE 40 MCG/ML (10ML) SYRINGE FOR IV PUSH (FOR BLOOD PRESSURE SUPPORT)
PREFILLED_SYRINGE | INTRAVENOUS | Status: AC
Start: 2016-03-21 — End: 2016-03-21
  Filled 2016-03-21: qty 10

## 2016-03-21 MED ORDER — HYDROMORPHONE HCL 1 MG/ML IJ SOLN
0.2500 mg | INTRAMUSCULAR | Status: DC | PRN
Start: 2016-03-21 — End: 2016-03-21
  Administered 2016-03-21 (×3): 0.5 mg via INTRAVENOUS

## 2016-03-21 MED ORDER — EPHEDRINE 5 MG/ML INJ
INTRAVENOUS | Status: AC
  Filled 2016-03-21: qty 10

## 2016-03-21 MED ORDER — SCOPOLAMINE 1 MG/3DAYS TD PT72
1.0000 | MEDICATED_PATCH | Freq: Once | TRANSDERMAL | Status: DC | PRN
Start: 2016-03-21 — End: 2016-03-21

## 2016-03-21 MED ORDER — HYDROMORPHONE HCL 1 MG/ML IJ SOLN
INTRAMUSCULAR | Status: AC
  Filled 2016-03-21: qty 1

## 2016-03-21 MED ORDER — LIDOCAINE HCL (CARDIAC) 20 MG/ML IV SOLN
INTRAVENOUS | Status: DC | PRN
  Administered 2016-03-21: 30 mg via INTRAVENOUS

## 2016-03-21 MED ORDER — PROPOFOL 10 MG/ML IV BOLUS
INTRAVENOUS | Status: DC | PRN
  Administered 2016-03-21: 150 mg via INTRAVENOUS

## 2016-03-21 MED ORDER — LACTATED RINGERS IV SOLN
INTRAVENOUS | Status: DC
  Administered 2016-03-21 (×2): via INTRAVENOUS

## 2016-03-21 MED ORDER — LIDOCAINE 2% (20 MG/ML) 5 ML SYRINGE
INTRAMUSCULAR | Status: AC
  Filled 2016-03-21: qty 5

## 2016-03-21 MED ORDER — LABETALOL HCL 5 MG/ML IV SOLN
INTRAVENOUS | Status: AC
  Filled 2016-03-21: qty 4

## 2016-03-21 MED ORDER — MEPERIDINE HCL 25 MG/ML IJ SOLN: 6.2500 mg | INTRAMUSCULAR | Status: DC | PRN

## 2016-03-21 MED ORDER — MIDAZOLAM HCL 2 MG/2ML IJ SOLN
INTRAMUSCULAR | Status: AC
  Filled 2016-03-21: qty 2

## 2016-03-21 MED ORDER — MIDAZOLAM HCL 2 MG/2ML IJ SOLN
1.0000 mg | INTRAMUSCULAR | Status: DC | PRN
  Administered 2016-03-21: 2 mg via INTRAVENOUS

## 2016-03-21 MED ORDER — HYDROCODONE-ACETAMINOPHEN 7.5-325 MG PO TABS: 1.0000 | ORAL_TABLET | Freq: Once | ORAL | Status: DC | PRN

## 2016-03-21 MED ORDER — ONDANSETRON HCL 4 MG/2ML IJ SOLN
INTRAMUSCULAR | Status: AC
  Filled 2016-03-21: qty 2

## 2016-03-21 MED ORDER — CEFAZOLIN SODIUM-DEXTROSE 2-4 GM/100ML-% IV SOLN
INTRAVENOUS | Status: AC
  Filled 2016-03-21: qty 100

## 2016-03-21 MED ORDER — DEXAMETHASONE SODIUM PHOSPHATE 10 MG/ML IJ SOLN
INTRAMUSCULAR | Status: AC
  Filled 2016-03-21: qty 1

## 2016-03-21 MED ORDER — SODIUM CHLORIDE 0.9 % IR SOLN
Status: DC | PRN
  Administered 2016-03-21: 500 mL

## 2016-03-21 MED ORDER — SUCCINYLCHOLINE CHLORIDE 200 MG/10ML IV SOSY
PREFILLED_SYRINGE | INTRAVENOUS | Status: AC
  Filled 2016-03-21: qty 10

## 2016-03-21 MED ORDER — 0.9 % SODIUM CHLORIDE (POUR BTL) OPTIME
TOPICAL | Status: DC | PRN
  Administered 2016-03-21: 1000 mL

## 2016-03-21 MED ORDER — FENTANYL CITRATE (PF) 100 MCG/2ML IJ SOLN
INTRAMUSCULAR | Status: AC
  Filled 2016-03-21: qty 2

## 2016-03-21 MED ORDER — LABETALOL HCL 5 MG/ML IV SOLN
2.5000 mg | Freq: Once | INTRAVENOUS | Status: AC
Start: 2016-03-21 — End: 2016-03-21
  Administered 2016-03-21 (×2): 2.5 mg via INTRAVENOUS

## 2016-03-21 MED ORDER — HYDROCODONE-ACETAMINOPHEN 5-325 MG PO TABS: 1.0000 | ORAL_TABLET | Freq: Every evening | ORAL | 0 refills | Status: DC | PRN

## 2016-03-21 MED ORDER — DEXAMETHASONE SODIUM PHOSPHATE 4 MG/ML IJ SOLN
INTRAMUSCULAR | Status: DC | PRN
  Administered 2016-03-21: 10 mg via INTRAVENOUS

## 2016-03-21 MED ORDER — CEFAZOLIN SODIUM-DEXTROSE 2-4 GM/100ML-% IV SOLN
2.0000 g | INTRAVENOUS | Status: AC
  Administered 2016-03-21: 2 g via INTRAVENOUS

## 2016-03-21 SURGICAL SUPPLY — 86 items
BAG DECANTER FOR FLEXI CONT (MISCELLANEOUS) ×3 IMPLANT
BANDAGE ACE 3X5.8 VEL STRL LF (GAUZE/BANDAGES/DRESSINGS) IMPLANT
BANDAGE ACE 4X5 VEL STRL LF (GAUZE/BANDAGES/DRESSINGS) ×6 IMPLANT
BANDAGE ACE 6X5 VEL STRL LF (GAUZE/BANDAGES/DRESSINGS) IMPLANT
BLADE CLIPPER SURG (BLADE) IMPLANT
BLADE DERMATOME SS (BLADE) IMPLANT
BLADE HEX COATED 2.75 (ELECTRODE) ×3 IMPLANT
BLADE SURG 10 STRL SS (BLADE) ×3 IMPLANT
BLADE SURG 15 STRL LF DISP TIS (BLADE) ×1 IMPLANT
BLADE SURG 15 STRL SS (BLADE) ×2
BNDG COHESIVE 4X5 TAN STRL (GAUZE/BANDAGES/DRESSINGS) IMPLANT
BNDG CONFORM 2 STRL LF (GAUZE/BANDAGES/DRESSINGS) IMPLANT
BNDG CONFORM 3 STRL LF (GAUZE/BANDAGES/DRESSINGS) IMPLANT
BNDG GAUZE ELAST 4 BULKY (GAUZE/BANDAGES/DRESSINGS) ×6 IMPLANT
BOOT STEPPER DURA MED (SOFTGOODS) ×3 IMPLANT
CANISTER SUCT 1200ML W/VALVE (MISCELLANEOUS) IMPLANT
CHLORAPREP W/TINT 26ML (MISCELLANEOUS) IMPLANT
CLOSURE WOUND 1/2 X4 (GAUZE/BANDAGES/DRESSINGS)
COTTONBALL LRG STERILE PKG (GAUZE/BANDAGES/DRESSINGS) IMPLANT
COVER BACK TABLE 60X90IN (DRAPES) ×3 IMPLANT
COVER MAYO STAND STRL (DRAPES) ×3 IMPLANT
DECANTER SPIKE VIAL GLASS SM (MISCELLANEOUS) IMPLANT
DERMABOND ADVANCED (GAUZE/BANDAGES/DRESSINGS)
DERMABOND ADVANCED .7 DNX12 (GAUZE/BANDAGES/DRESSINGS) IMPLANT
DERMACARRIERS GRAFT 1 TO 1.5 (DISPOSABLE)
DRAPE INCISE IOBAN 66X45 STRL (DRAPES) IMPLANT
DRAPE U-SHAPE 76X120 STRL (DRAPES) ×3 IMPLANT
DRESSING DUODERM 4X4 STERILE (GAUZE/BANDAGES/DRESSINGS) ×3 IMPLANT
DRSG ADAPTIC 3X8 NADH LF (GAUZE/BANDAGES/DRESSINGS) ×6 IMPLANT
DRSG EMULSION OIL 3X3 NADH (GAUZE/BANDAGES/DRESSINGS) IMPLANT
DRSG OPSITE 6X11 MED (GAUZE/BANDAGES/DRESSINGS) IMPLANT
DRSG PAD ABDOMINAL 8X10 ST (GAUZE/BANDAGES/DRESSINGS) ×3 IMPLANT
DRSG TEGADERM 4X10 (GAUZE/BANDAGES/DRESSINGS) IMPLANT
ELECT REM PT RETURN 9FT ADLT (ELECTROSURGICAL) ×3
ELECTRODE REM PT RTRN 9FT ADLT (ELECTROSURGICAL) ×1 IMPLANT
GAUZE SPONGE 4X4 12PLY STRL (GAUZE/BANDAGES/DRESSINGS) ×3 IMPLANT
GAUZE XEROFORM 1X8 LF (GAUZE/BANDAGES/DRESSINGS) IMPLANT
GAUZE XEROFORM 5X9 LF (GAUZE/BANDAGES/DRESSINGS) IMPLANT
GLOVE BIO SURGEON STRL SZ 6.5 (GLOVE) ×4 IMPLANT
GLOVE BIO SURGEON STRL SZ8 (GLOVE) IMPLANT
GLOVE BIO SURGEONS STRL SZ 6.5 (GLOVE) ×2
GOWN STRL REUS W/ TWL LRG LVL3 (GOWN DISPOSABLE) ×2 IMPLANT
GOWN STRL REUS W/TWL LRG LVL3 (GOWN DISPOSABLE) ×4
GRAFT DERMACARRIERS 1 TO 1.5 (DISPOSABLE) IMPLANT
MANIFOLD NEPTUNE II (INSTRUMENTS) IMPLANT
MATRIX SURGICAL PSM 7X10CM (Tissue) ×3 IMPLANT
MICROMATRIX 1000MG (Tissue) ×3 IMPLANT
NEEDLE HYPO 25X1 1.5 SAFETY (NEEDLE) ×3 IMPLANT
NS IRRIG 1000ML POUR BTL (IV SOLUTION) ×3 IMPLANT
PACK BASIN DAY SURGERY FS (CUSTOM PROCEDURE TRAY) ×3 IMPLANT
PADDING CAST ABS 3INX4YD NS (CAST SUPPLIES)
PADDING CAST ABS 4INX4YD NS (CAST SUPPLIES)
PADDING CAST ABS COTTON 3X4 (CAST SUPPLIES) IMPLANT
PADDING CAST ABS COTTON 4X4 ST (CAST SUPPLIES) IMPLANT
PENCIL BUTTON HOLSTER BLD 10FT (ELECTRODE) ×3 IMPLANT
SHEET MEDIUM DRAPE 40X70 STRL (DRAPES) ×3 IMPLANT
SLEEVE SCD COMPRESS KNEE MED (MISCELLANEOUS) ×3 IMPLANT
SOLUTION PARTIC MCRMTRX 1000MG (Tissue) ×1 IMPLANT
SPLINT FIBERGLASS 3X35 (CAST SUPPLIES) IMPLANT
SPLINT FIBERGLASS 4X30 (CAST SUPPLIES) IMPLANT
SPLINT PLASTER CAST XFAST 3X15 (CAST SUPPLIES) IMPLANT
SPLINT PLASTER XTRA FASTSET 3X (CAST SUPPLIES)
SPONGE GAUZE 4X4 12PLY STER LF (GAUZE/BANDAGES/DRESSINGS) IMPLANT
SPONGE LAP 18X18 X RAY DECT (DISPOSABLE) ×3 IMPLANT
STAPLER VISISTAT 35W (STAPLE) IMPLANT
STOCKINETTE IMPERVIOUS LG (DRAPES) IMPLANT
STRIP CLOSURE SKIN 1/2X4 (GAUZE/BANDAGES/DRESSINGS) IMPLANT
SURGILUBE 2OZ TUBE FLIPTOP (MISCELLANEOUS) IMPLANT
SUT MON AB 5-0 PS2 18 (SUTURE) ×3 IMPLANT
SUT SILK 3 0 PS 1 (SUTURE) IMPLANT
SUT SILK 3 0 SH CR/8 (SUTURE) IMPLANT
SUT SILK 4 0 PS 2 (SUTURE) IMPLANT
SUT VIC AB 5-0 P-3 18X BRD (SUTURE) ×1 IMPLANT
SUT VIC AB 5-0 P3 18 (SUTURE) ×2
SUT VIC AB 5-0 PS2 18 (SUTURE) ×3 IMPLANT
SUT VICRYL 4-0 PS2 18IN ABS (SUTURE) IMPLANT
SYR BULB IRRIGATION 50ML (SYRINGE) ×3 IMPLANT
SYR CONTROL 10ML LL (SYRINGE) ×3 IMPLANT
TAPE HYPAFIX 6 X30' (GAUZE/BANDAGES/DRESSINGS)
TAPE HYPAFIX 6X30 (GAUZE/BANDAGES/DRESSINGS) IMPLANT
TOWEL OR 17X24 6PK STRL BLUE (TOWEL DISPOSABLE) ×3 IMPLANT
TRAY DSU PREP LF (CUSTOM PROCEDURE TRAY) ×3 IMPLANT
TUBE CONNECTING 20'X1/4 (TUBING) ×1
TUBE CONNECTING 20X1/4 (TUBING) ×2 IMPLANT
UNDERPAD 30X30 (UNDERPADS AND DIAPERS) ×3 IMPLANT
YANKAUER SUCT BULB TIP NO VENT (SUCTIONS) ×3 IMPLANT
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
## 2016-03-21 NOTE — Discharge Instructions (Signed)
KY gel to foot daily Do not get wet    Post Anesthesia Home Care Instructions  Activity: Get plenty of rest for the remainder of the day. A responsible adult should stay with you for 24 hours following the procedure.  For the next 24 hours, DO NOT: -Drive a car -Advertising copywriterperate machinery -Drink alcoholic beverages -Take any medication unless instructed by your physician -Make any legal decisions or sign important papers.  Meals: Start with liquid foods such as gelatin or soup. Progress to regular foods as tolerated. Avoid greasy, spicy, heavy foods. If nausea and/or vomiting occur, drink only clear liquids until the nausea and/or vomiting subsides. Call your physician if vomiting continues.  Special Instructions/Symptoms: Your throat may feel dry or sore from the anesthesia or the breathing tube placed in your throat during surgery. If this causes discomfort, gargle with warm salt water. The discomfort should disappear within 24 hours.  If you had a scopolamine patch placed behind your ear for the management of post- operative nausea and/or vomiting:  1. The medication in the patch is effective for 72 hours, after which it should be removed.  Wrap patch in a tissue and discard in the trash. Wash hands thoroughly with soap and water. 2. You may remove the patch earlier than 72 hours if you experience unpleasant side effects which may include dry mouth, dizziness or visual disturbances. 3. Avoid touching the patch. Wash your hands with soap and water after contact with the patch.
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
## 2016-03-21 NOTE — Anesthesia Procedure Notes (Signed)
Procedure Name: LMA Insertion Date/Time: 03/21/2016 9:08 AM Performed by: Zenia ResidesPAYNE, Caydn Justen D Pre-anesthesia Checklist: Patient identified, Emergency Drugs available, Suction available and Patient being monitored Patient Re-evaluated:Patient Re-evaluated prior to inductionOxygen Delivery Method: Circle system utilized Preoxygenation: Pre-oxygenation with 100% oxygen Intubation Type: IV induction Ventilation: Mask ventilation without difficulty LMA: LMA inserted LMA Size: 3.0 Number of attempts: 1 Airway Equipment and Method: Bite block Placement Confirmation: positive ETCO2 Tube secured with: Tape Dental Injury: Teeth and Oropharynx as per pre-operative assessment
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
## 2016-03-25 ENCOUNTER — Encounter (HOSPITAL_BASED_OUTPATIENT_CLINIC_OR_DEPARTMENT_OTHER): Payer: Self-pay | Admitting: Plastic Surgery

## 2016-04-18 ENCOUNTER — Ambulatory Visit: Payer: Self-pay | Admitting: Plastic Surgery

## 2016-04-18 DIAGNOSIS — S91302A Unspecified open wound, left foot, initial encounter: Secondary | ICD-10-CM

## 2016-04-24 ENCOUNTER — Ambulatory Visit (HOSPITAL_BASED_OUTPATIENT_CLINIC_OR_DEPARTMENT_OTHER): Admission: RE | Admit: 2016-04-24 | Source: Ambulatory Visit | Admitting: Plastic Surgery

## 2016-04-24 ENCOUNTER — Encounter (HOSPITAL_BASED_OUTPATIENT_CLINIC_OR_DEPARTMENT_OTHER): Admission: RE | Payer: Self-pay | Source: Ambulatory Visit

## 2016-04-24 SURGERY — IRRIGATION AND DEBRIDEMENT OF WOUND WITH SPLIT THICKNESS SKIN GRAFT
Anesthesia: General | Laterality: Left

## 2016-05-17 ENCOUNTER — Other Ambulatory Visit (HOSPITAL_COMMUNITY): Payer: Self-pay | Admitting: Family Medicine

## 2016-05-17 DIAGNOSIS — Z1231 Encounter for screening mammogram for malignant neoplasm of breast: Secondary | ICD-10-CM

## 2016-05-23 ENCOUNTER — Ambulatory Visit (HOSPITAL_COMMUNITY)
Admission: RE | Admit: 2016-05-23 | Discharge: 2016-05-23 | Disposition: A | Discharge status: Home / Self Care | Source: Ambulatory Visit | Attending: Family Medicine | Admitting: Family Medicine

## 2016-05-23 DIAGNOSIS — Z1231 Encounter for screening mammogram for malignant neoplasm of breast: Secondary | ICD-10-CM | POA: Insufficient documentation

## 2017-04-03 DIAGNOSIS — G4700 Insomnia, unspecified: Secondary | ICD-10-CM | POA: Insufficient documentation

## 2017-08-15 ENCOUNTER — Other Ambulatory Visit (HOSPITAL_COMMUNITY): Payer: Self-pay | Admitting: Family Medicine

## 2017-08-15 DIAGNOSIS — Z1231 Encounter for screening mammogram for malignant neoplasm of breast: Secondary | ICD-10-CM

## 2017-08-20 ENCOUNTER — Encounter (HOSPITAL_COMMUNITY): Payer: Self-pay

## 2017-08-20 ENCOUNTER — Ambulatory Visit (HOSPITAL_COMMUNITY)
Admission: RE | Admit: 2017-08-20 | Discharge: 2017-08-20 | Disposition: A | Discharge status: Home / Self Care | Source: Ambulatory Visit | Attending: Family Medicine | Admitting: Family Medicine

## 2017-08-20 DIAGNOSIS — Z1231 Encounter for screening mammogram for malignant neoplasm of breast: Secondary | ICD-10-CM | POA: Diagnosis not present

## 2018-08-11 ENCOUNTER — Other Ambulatory Visit (HOSPITAL_COMMUNITY): Payer: Self-pay | Admitting: Family Medicine

## 2018-08-11 DIAGNOSIS — Z1231 Encounter for screening mammogram for malignant neoplasm of breast: Secondary | ICD-10-CM

## 2018-08-24 ENCOUNTER — Ambulatory Visit (HOSPITAL_COMMUNITY)
Admission: RE | Admit: 2018-08-24 | Discharge: 2018-08-24 | Disposition: A | Discharge status: Home / Self Care | Source: Ambulatory Visit | Attending: Family Medicine | Admitting: Family Medicine

## 2018-08-24 ENCOUNTER — Other Ambulatory Visit: Payer: Self-pay

## 2018-08-24 DIAGNOSIS — Z1231 Encounter for screening mammogram for malignant neoplasm of breast: Secondary | ICD-10-CM | POA: Insufficient documentation

## 2018-08-25 ENCOUNTER — Other Ambulatory Visit: Payer: Self-pay | Admitting: Family Medicine

## 2018-08-25 ENCOUNTER — Other Ambulatory Visit (HOSPITAL_COMMUNITY): Payer: Self-pay | Admitting: Family Medicine

## 2018-08-25 DIAGNOSIS — R945 Abnormal results of liver function studies: Secondary | ICD-10-CM

## 2018-08-25 DIAGNOSIS — F1010 Alcohol abuse, uncomplicated: Secondary | ICD-10-CM

## 2018-09-01 ENCOUNTER — Ambulatory Visit (HOSPITAL_COMMUNITY)
Admission: RE | Admit: 2018-09-01 | Discharge: 2018-09-01 | Disposition: A | Discharge status: Home / Self Care | Source: Ambulatory Visit | Attending: Family Medicine | Admitting: Family Medicine

## 2018-09-01 ENCOUNTER — Other Ambulatory Visit: Payer: Self-pay

## 2018-09-01 DIAGNOSIS — R945 Abnormal results of liver function studies: Secondary | ICD-10-CM | POA: Diagnosis not present

## 2018-09-01 DIAGNOSIS — F1010 Alcohol abuse, uncomplicated: Secondary | ICD-10-CM | POA: Insufficient documentation

## 2019-04-15 ENCOUNTER — Ambulatory Visit: Attending: Internal Medicine

## 2019-04-15 DIAGNOSIS — Z23 Encounter for immunization: Secondary | ICD-10-CM

## 2019-04-15 NOTE — Progress Notes (Signed)
   Covid-19 Vaccination Clinic  Name:  Carla Suarez    MRN: [id number] DOB: 1965-12-03  04/15/2019  Carla Suarez was observed post Covid-19 immunization for 15 minutes without incident. She was provided with Vaccine Information Sheet and instruction to access the V-Safe system.   Carla Suarez was instructed to call 911 with any severe reactions post vaccine: Marland Kitchen Difficulty breathing  . Swelling of face and throat  . A fast heartbeat  . A bad rash all over body  . Dizziness and weakness   Immunizations Administered    Name Date Dose VIS Date Route   Moderna COVID-19 Vaccine 04/15/2019  8:15 AM 0.5 mL 12/15/2018 Intramuscular   Manufacturer: Moderna   Lot: [id number]   NDC: 70177-939-03

## 2019-05-18 ENCOUNTER — Ambulatory Visit

## 2019-05-25 ENCOUNTER — Ambulatory Visit: Attending: Internal Medicine

## 2019-05-25 ENCOUNTER — Ambulatory Visit

## 2019-05-25 DIAGNOSIS — Z23 Encounter for immunization: Secondary | ICD-10-CM

## 2019-05-25 NOTE — Progress Notes (Signed)
   Covid-19 Vaccination Clinic  Name:  Carla Suarez    MRN: [id number] DOB: 17-Aug-1965  05/25/2019  Ms. Scheeler was observed post Covid-19 immunization for 15 minutes without incident. She was provided with Vaccine Information Sheet and instruction to access the V-Safe system.   Ms. Wilcoxson was instructed to call 911 with any severe reactions post vaccine: Marland Kitchen Difficulty breathing  . Swelling of face and throat  . A fast heartbeat  . A bad rash all over body  . Dizziness and weakness   Immunizations Administered    Name Date Dose VIS Date Route   Moderna COVID-19 Vaccine 05/25/2019  9:22 AM 0.5 mL 12/2018 Intramuscular   Manufacturer: Moderna   Lot: [id number]   NDC: 84720-721-82

## 2019-05-31 ENCOUNTER — Telehealth: Payer: Self-pay | Admitting: Orthopedic Surgery

## 2019-05-31 ENCOUNTER — Encounter: Payer: Self-pay | Admitting: Orthopedic Surgery

## 2019-05-31 ENCOUNTER — Ambulatory Visit (INDEPENDENT_AMBULATORY_CARE_PROVIDER_SITE_OTHER): Admitting: Orthopedic Surgery

## 2019-05-31 ENCOUNTER — Other Ambulatory Visit: Payer: Self-pay

## 2019-05-31 ENCOUNTER — Ambulatory Visit

## 2019-05-31 VITALS — Ht 63.5 in | Wt 103.0 lb

## 2019-05-31 DIAGNOSIS — G8929 Other chronic pain: Secondary | ICD-10-CM

## 2019-05-31 DIAGNOSIS — M25562 Pain in left knee: Secondary | ICD-10-CM

## 2019-05-31 MED ORDER — MELOXICAM 7.5 MG PO TABS: 7.5000 mg | ORAL_TABLET | Freq: Every day | ORAL | 5 refills | Status: DC

## 2019-05-31 NOTE — Patient Instructions (Signed)
Start anti inflammatory medication and exercises   Take bp daily

## 2019-05-31 NOTE — Telephone Encounter (Signed)
Call (voice mail) received from patient regarding pharmacy information - states that her prescription for medication:   meloxicam (MOBIC) 7.5 MG tablet was to be sent to [facility]. Called back to patient to let her know that we are sending the message to clinic staff for Dr Romeo Apple - reached her voice mail, left message.
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
## 2019-05-31 NOTE — Telephone Encounter (Signed)
No new notes - appointment completed as scheduled.

## 2019-06-01 MED ORDER — MELOXICAM 7.5 MG PO TABS: 7.5000 mg | ORAL_TABLET | Freq: Every day | ORAL | 1 refills | Status: AC

## 2019-06-01 NOTE — Telephone Encounter (Signed)
Sent in to correct pharmacy.

## 2019-07-27 ENCOUNTER — Other Ambulatory Visit (HOSPITAL_COMMUNITY): Payer: Self-pay | Admitting: Family Medicine

## 2019-07-27 DIAGNOSIS — Z1231 Encounter for screening mammogram for malignant neoplasm of breast: Secondary | ICD-10-CM

## 2019-08-02 ENCOUNTER — Ambulatory Visit: Admitting: Orthopedic Surgery

## 2019-08-09 ENCOUNTER — Other Ambulatory Visit (HOSPITAL_COMMUNITY): Payer: Self-pay | Admitting: Family Medicine

## 2019-08-09 ENCOUNTER — Other Ambulatory Visit: Payer: Self-pay | Admitting: Family Medicine

## 2019-08-09 DIAGNOSIS — R634 Abnormal weight loss: Secondary | ICD-10-CM

## 2019-08-09 DIAGNOSIS — R109 Unspecified abdominal pain: Secondary | ICD-10-CM

## 2019-08-24 ENCOUNTER — Encounter: Admitting: Adult Health

## 2019-08-26 ENCOUNTER — Ambulatory Visit (HOSPITAL_COMMUNITY)

## 2019-08-26 ENCOUNTER — Encounter (HOSPITAL_COMMUNITY): Payer: Self-pay

## 2019-08-27 ENCOUNTER — Ambulatory Visit (HOSPITAL_COMMUNITY)

## 2020-01-06 ENCOUNTER — Inpatient Hospital Stay (HOSPITAL_COMMUNITY): Admission: RE | Admit: 2020-01-06 | Source: Ambulatory Visit

## 2020-01-17 ENCOUNTER — Inpatient Hospital Stay (HOSPITAL_COMMUNITY): Admission: RE | Admit: 2020-01-17 | Source: Ambulatory Visit

## 2020-02-29 ENCOUNTER — Other Ambulatory Visit: Admitting: Adult Health

## 2020-03-02 ENCOUNTER — Other Ambulatory Visit: Admitting: Obstetrics & Gynecology

## 2020-08-17 IMAGING — MG DIGITAL SCREENING BILATERAL MAMMOGRAM WITH TOMO AND CAD
8 series · 9 of 24 positions shown · non-contrast
Comparison: Previous exam(s).

CLINICAL DATA: Screening.

EXAM:
DIGITAL SCREENING BILATERAL MAMMOGRAM WITH TOMO AND CAD

[L CC synth-2D]
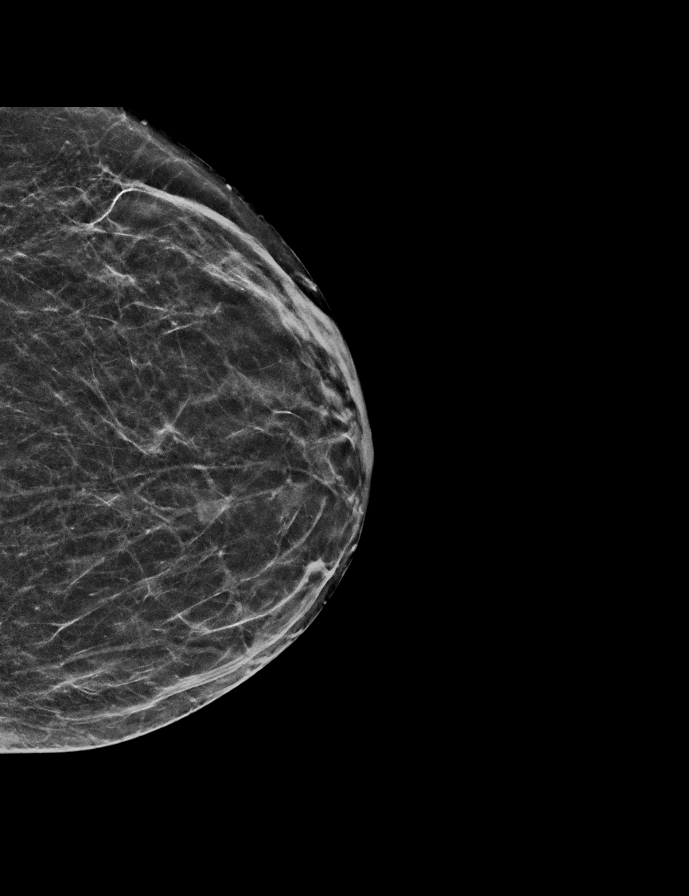

[R CC synth-2D]
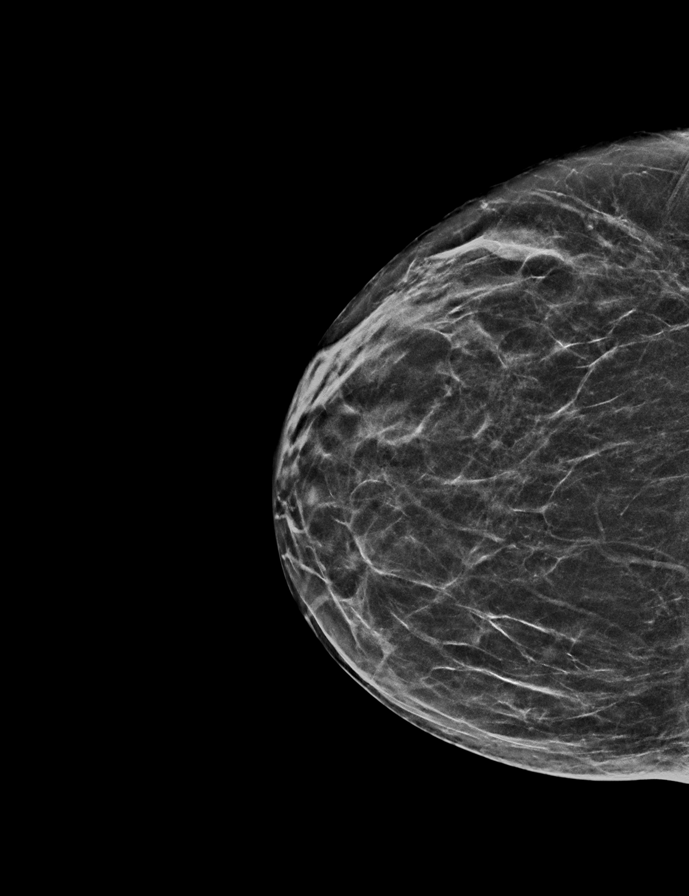

[R MLO synth-2D]
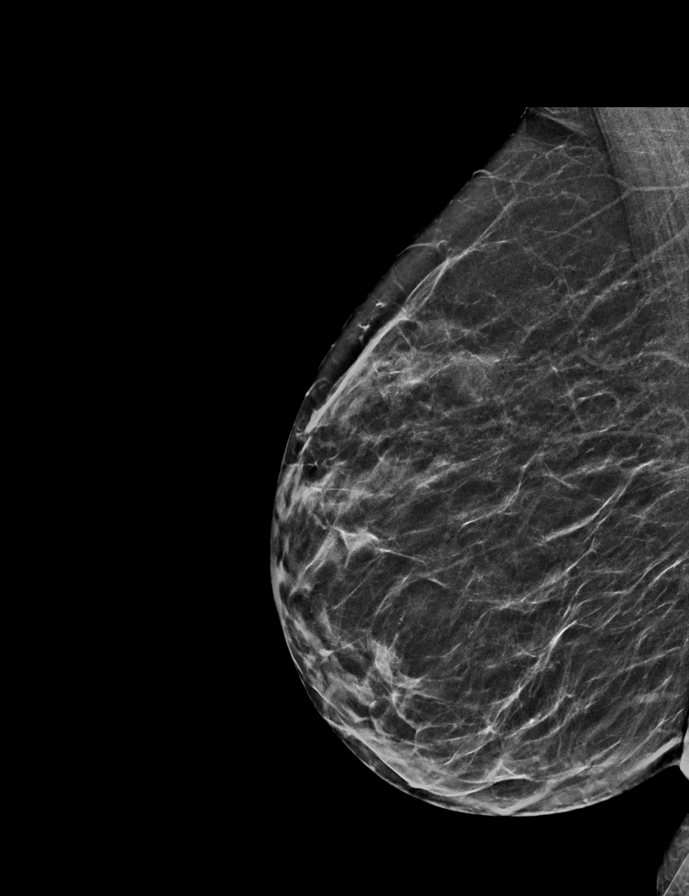

[L MLO synth-2D]
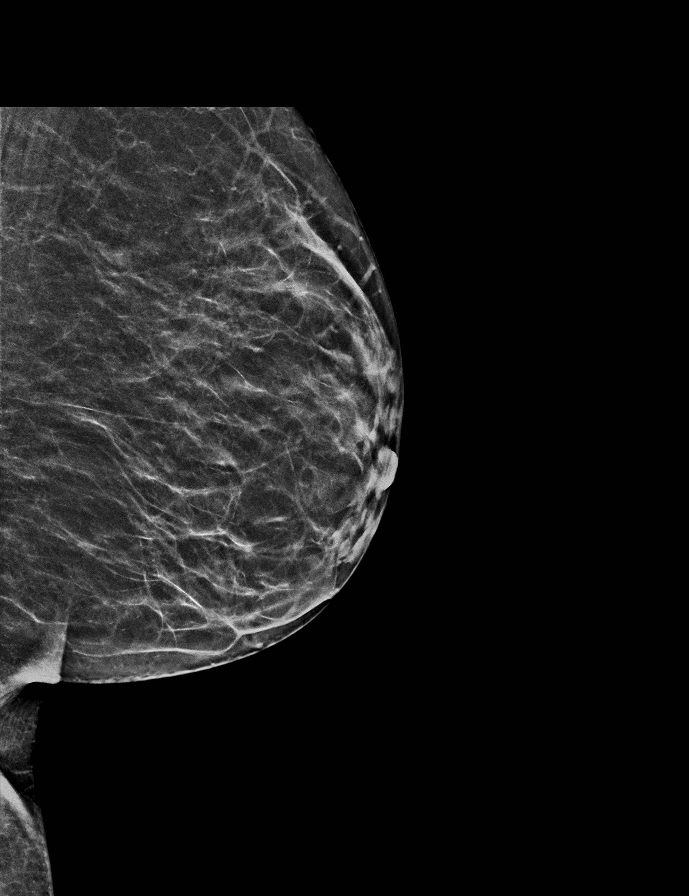

[L CC tomo · 2 of 45 frames shown]
[frame 15/45]
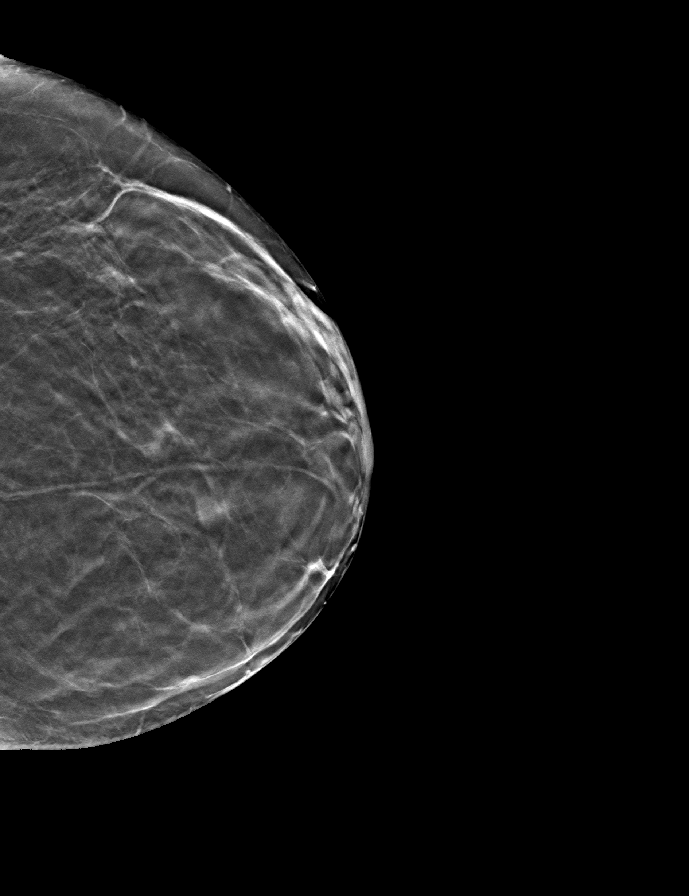
[frame 23/45]
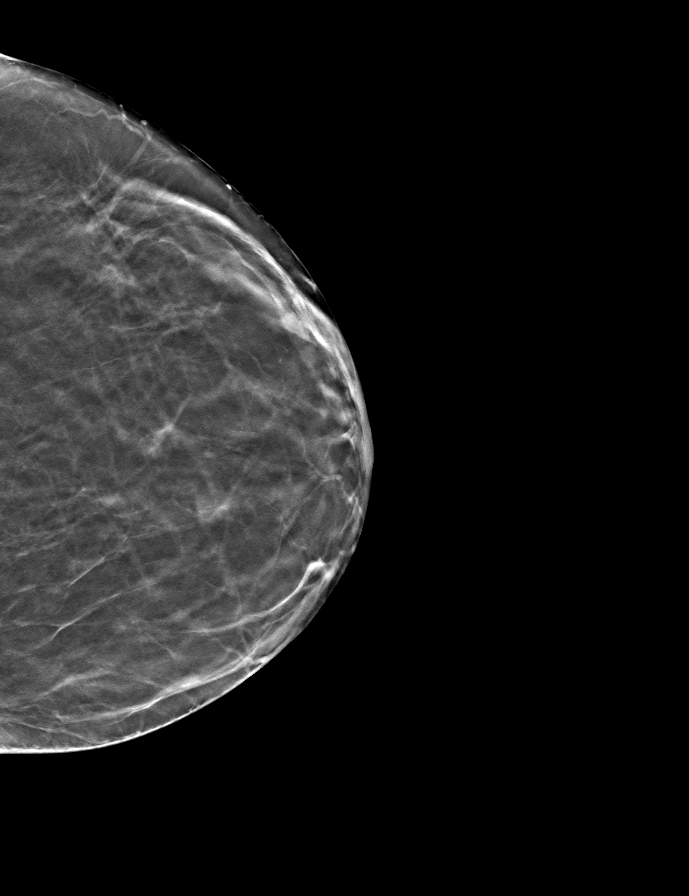

[L MLO tomo · tomo slice 23/46.0]
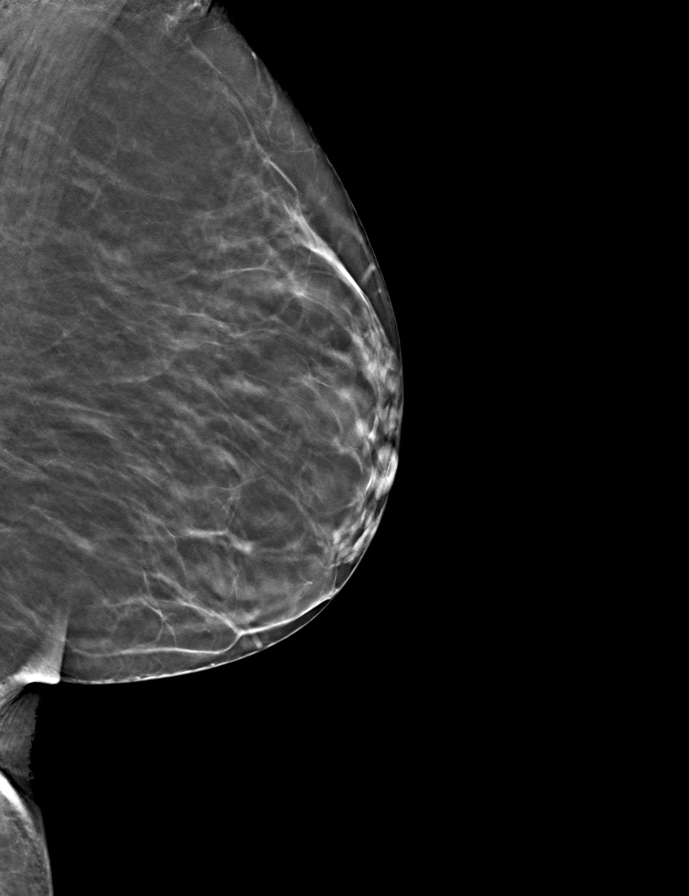

[R MLO tomo · tomo slice 25/48.0]
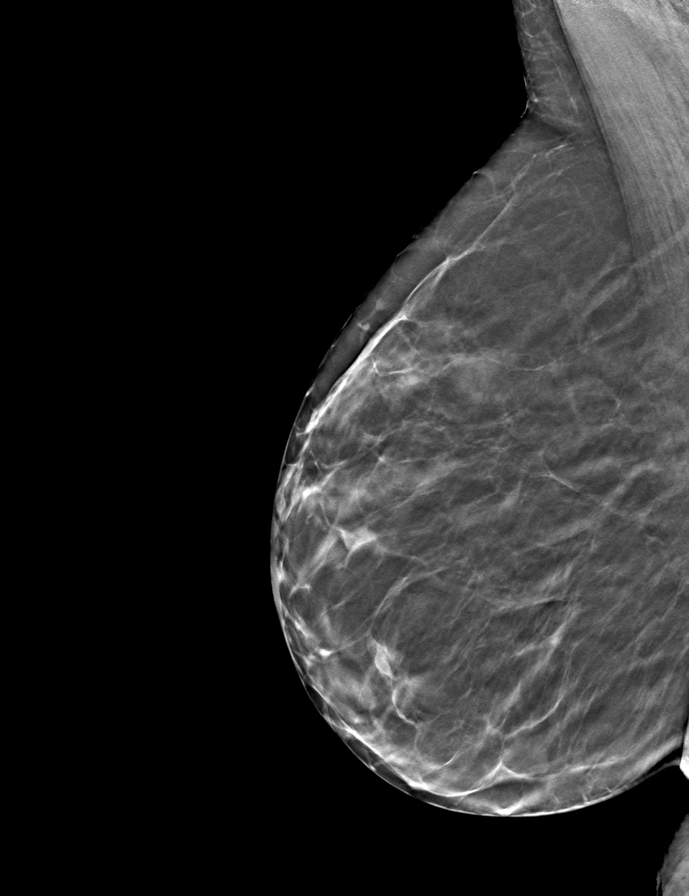

[R CC tomo · tomo slice 25/49.0]
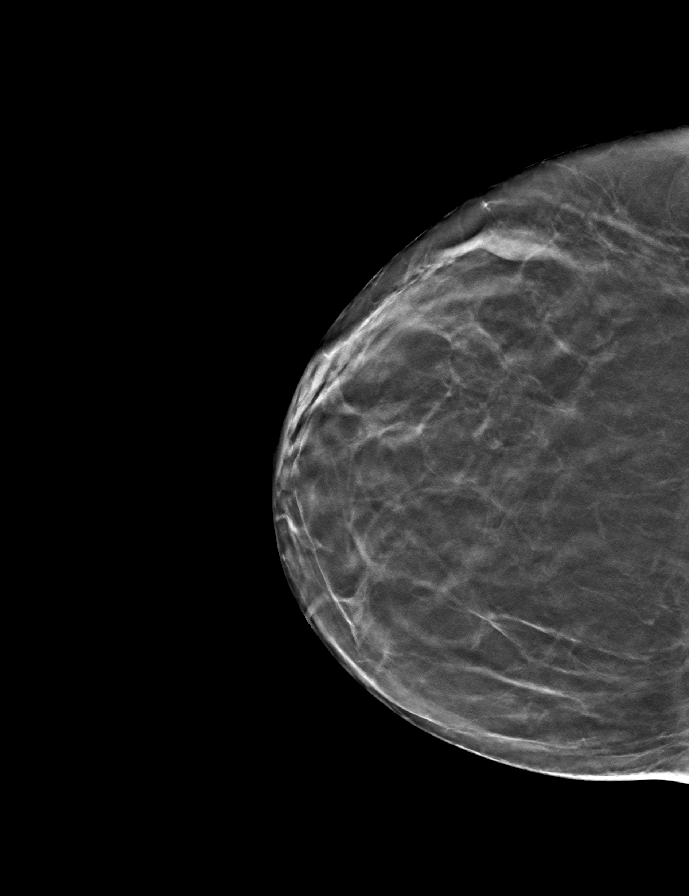

[9 of 24 positions shown; findings below may reference images not displayed]

ACR Breast Density Category c: The breast tissue is heterogeneously
dense, which may obscure small masses.
FINDINGS: There are no findings suspicious for malignancy. Images were
processed with CAD.
IMPRESSION: No mammographic evidence of malignancy. A result letter of this
screening mammogram will be mailed directly to the patient.

RECOMMENDATION:
Screening mammogram in one year. (Code:[id number])

BI-RADS CATEGORY  1: Negative.

## 2020-08-25 IMAGING — US ULTRASOUND ABDOMEN LIMITED
1 series · 14 of 25 positions shown · non-contrast
Comparison: 06/11/2006 by report only

CLINICAL DATA: Elevated LFTs x6 months

EXAM:
ULTRASOUND ABDOMEN LIMITED RIGHT UPPER QUADRANT

[Series 1: ultrasound abdomen limited · 14 of 69 slices shown]
[im 1/69]
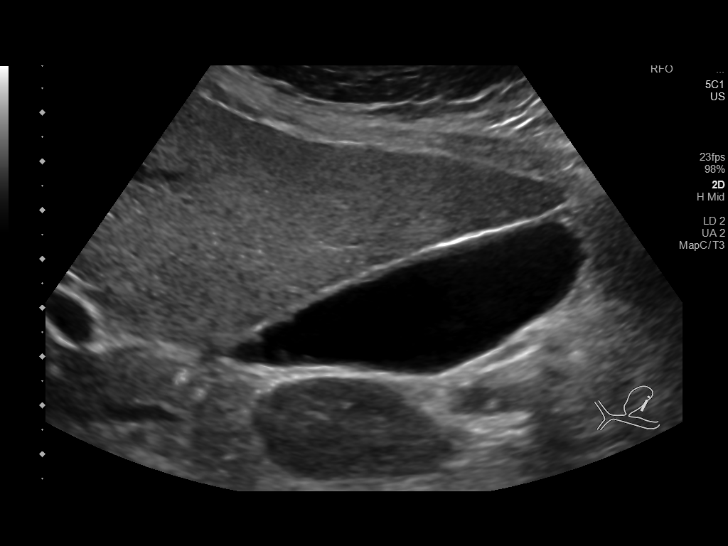
[im 6/69]
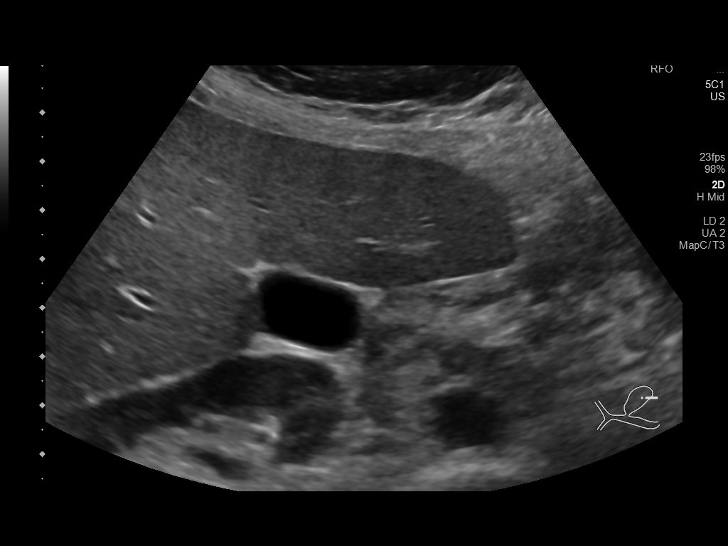
[im 12/69]
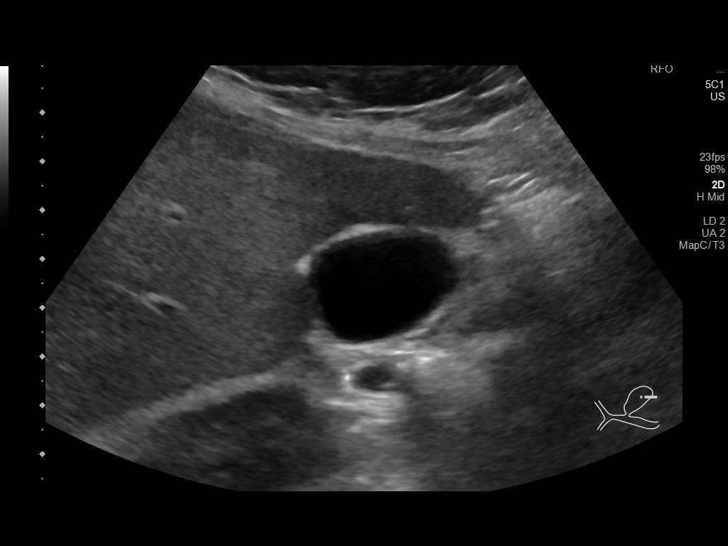
[im 18/69]
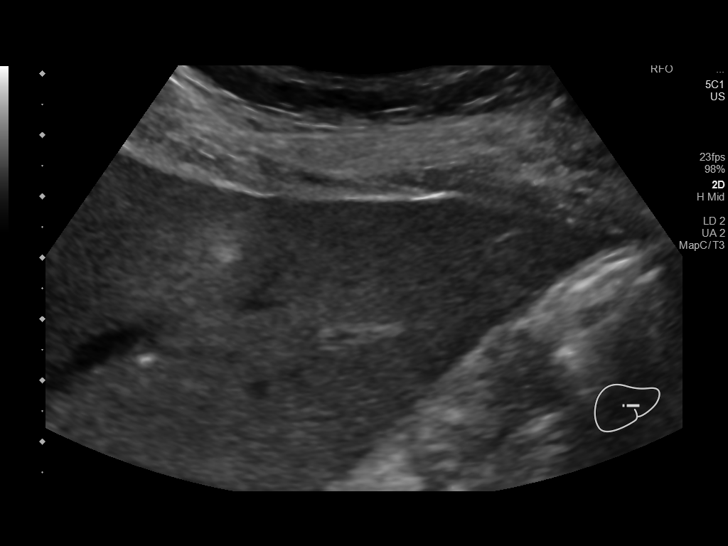
[im 23/69]
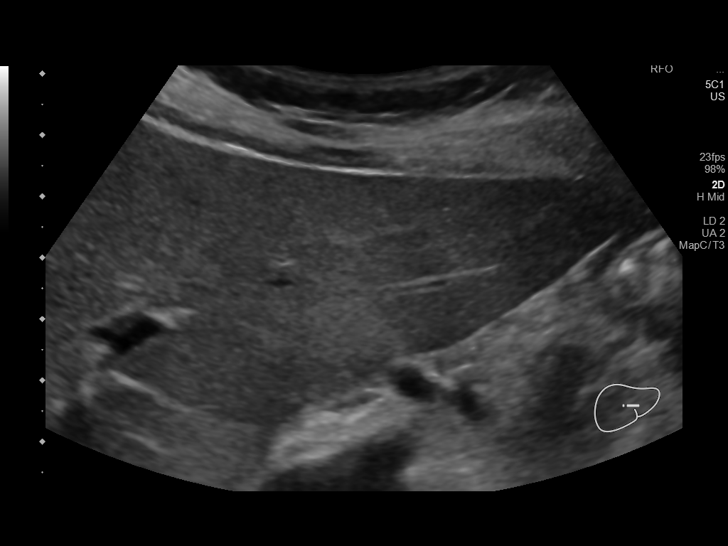
[im 26/69]
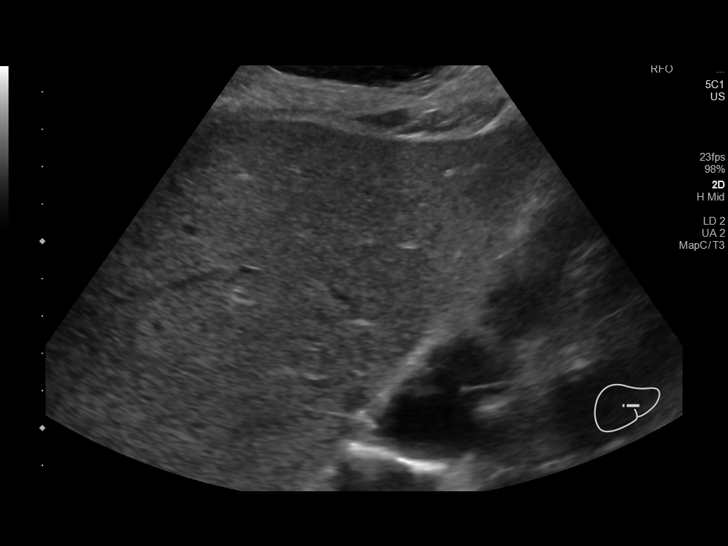
[im 32/69]
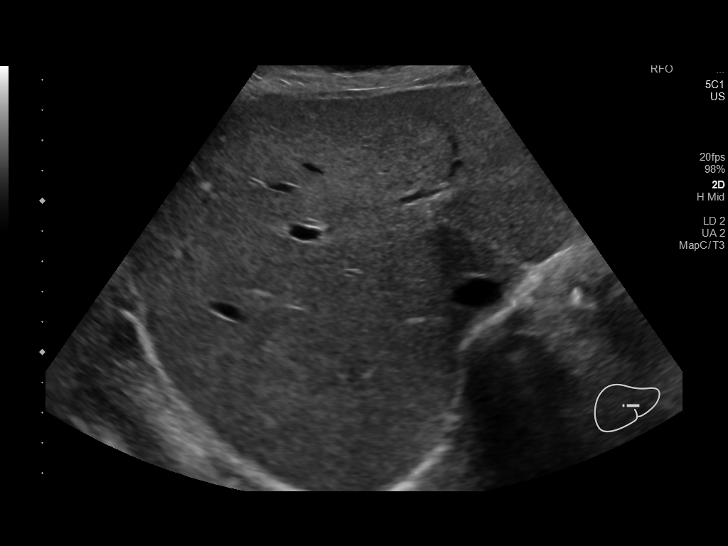
[im 37/69]
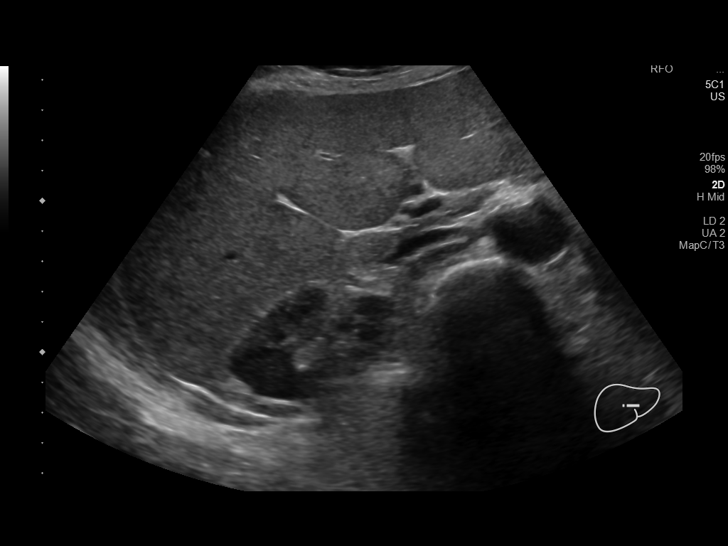
[im 43/69]
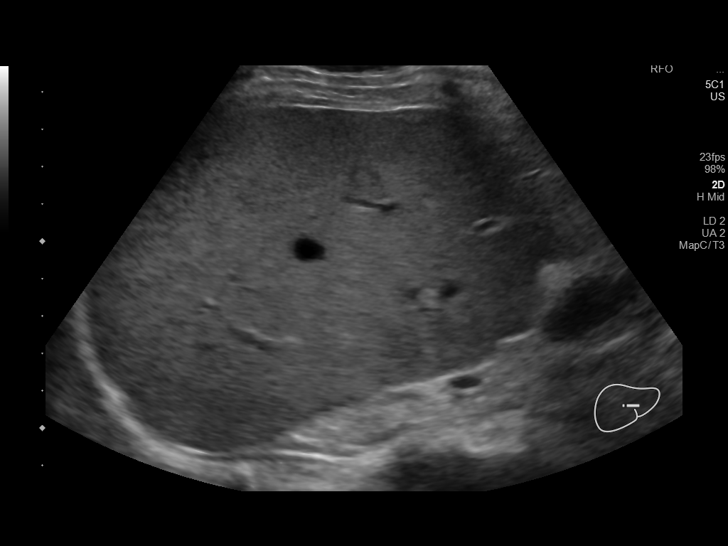
[im 46/69]
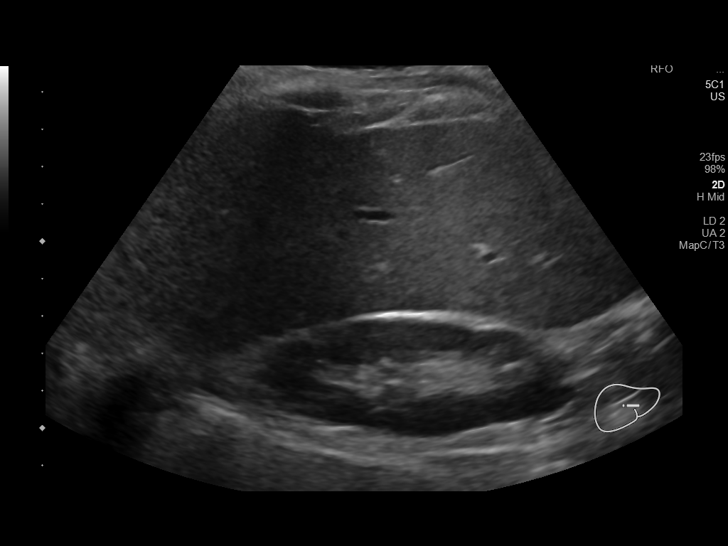
[im 52/69]
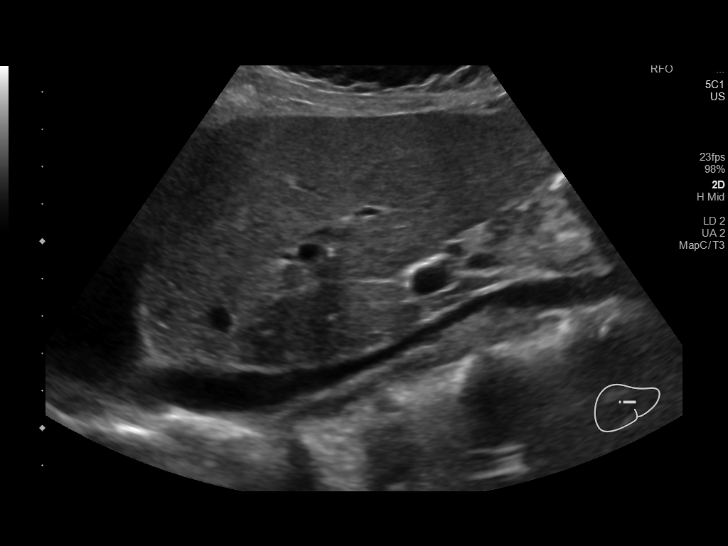
[im 57/69]
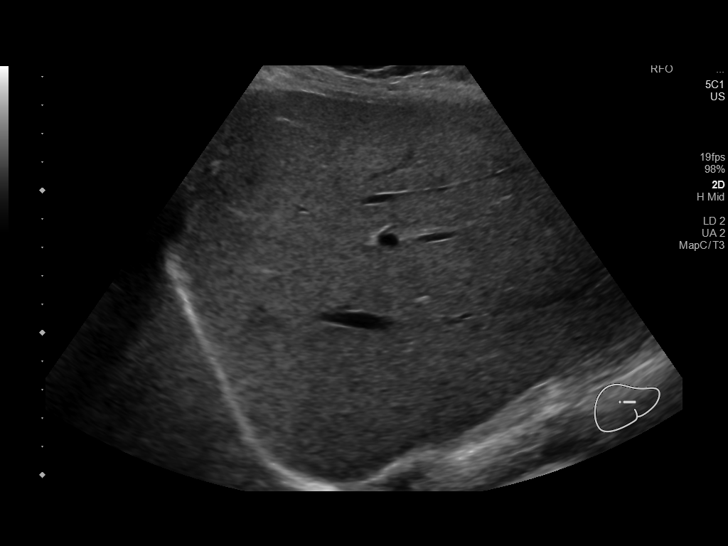
[im 63/69]
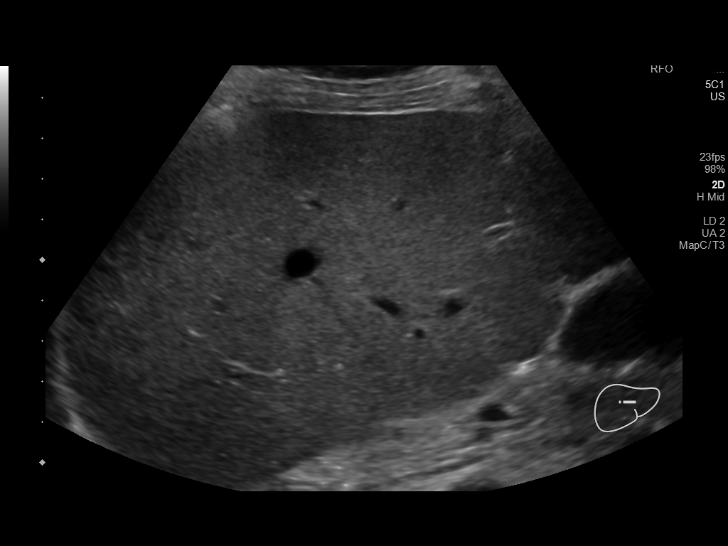
[im 69/69]
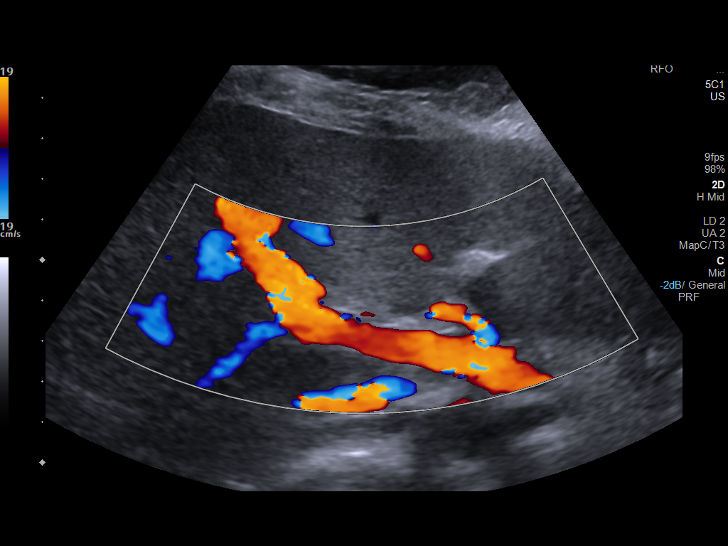

[14 of 25 positions shown; findings below may reference images not displayed]

FINDINGS: Gallbladder:

No gallstones or wall thickening visualized. No sonographic Murphy
sign noted by sonographer.

Common bile duct:

Diameter: 3.2 mm, unremarkable

Liver:

No focal lesion identified. Within normal limits in parenchymal
echogenicity. Portal vein is patent on color Doppler imaging with
normal direction of blood flow towards the liver.

Other: None.
IMPRESSION: Negative

## 2021-01-24 ENCOUNTER — Other Ambulatory Visit (HOSPITAL_COMMUNITY): Payer: Self-pay | Admitting: Family Medicine

## 2021-01-24 ENCOUNTER — Other Ambulatory Visit (HOSPITAL_COMMUNITY): Payer: Self-pay | Admitting: Nurse Practitioner

## 2021-01-24 DIAGNOSIS — Z1231 Encounter for screening mammogram for malignant neoplasm of breast: Secondary | ICD-10-CM

## 2021-01-29 ENCOUNTER — Ambulatory Visit (HOSPITAL_COMMUNITY)

## 2021-01-31 ENCOUNTER — Ambulatory Visit (HOSPITAL_COMMUNITY)
Admission: RE | Admit: 2021-01-31 | Discharge: 2021-01-31 | Disposition: A | Discharge status: Home / Self Care | Source: Ambulatory Visit | Attending: Nurse Practitioner | Admitting: Nurse Practitioner

## 2021-01-31 ENCOUNTER — Other Ambulatory Visit: Payer: Self-pay

## 2021-01-31 DIAGNOSIS — Z1231 Encounter for screening mammogram for malignant neoplasm of breast: Secondary | ICD-10-CM | POA: Insufficient documentation

## 2021-02-22 ENCOUNTER — Other Ambulatory Visit (HOSPITAL_COMMUNITY)
Admission: RE | Admit: 2021-02-22 | Discharge: 2021-02-22 | Disposition: A | Discharge status: Home / Self Care | Source: Ambulatory Visit | Attending: Obstetrics & Gynecology | Admitting: Obstetrics & Gynecology

## 2021-02-22 ENCOUNTER — Encounter: Payer: Self-pay | Admitting: Obstetrics & Gynecology

## 2021-02-22 ENCOUNTER — Other Ambulatory Visit: Payer: Self-pay

## 2021-02-22 ENCOUNTER — Ambulatory Visit (INDEPENDENT_AMBULATORY_CARE_PROVIDER_SITE_OTHER): Admitting: Obstetrics & Gynecology

## 2021-02-22 VITALS — BP 175/93 | HR 76 | Ht 63.5 in | Wt 103.4 lb

## 2021-02-22 DIAGNOSIS — Z01419 Encounter for gynecological examination (general) (routine) without abnormal findings: Secondary | ICD-10-CM | POA: Diagnosis not present

## 2021-02-22 DIAGNOSIS — Z124 Encounter for screening for malignant neoplasm of cervix: Secondary | ICD-10-CM | POA: Diagnosis not present

## 2021-02-22 NOTE — Progress Notes (Signed)
° °  GYN EXAMINATION Patient name: Carla Suarez MRN [id number]  Date of birth: 1965/06/20 Chief Complaint:   New Patient (Initial Visit) and Gynecologic Exam  History of Present Illness:   Carla Suarez is a 56 y.o. G1P1 PM female being seen today for a routine gyn exam.  Reports no acute complaints or concerns.  Denies vaginal bleeding, discharge, itching or irritation.  Denies pelvic or abdominal pain.  Last pap due today.  Last mammogram: 01/2021. Last colonoscopy: 2017  Depression screen [facility] 2/9 02/22/2021  Decreased Interest 0  Down, Depressed, Hopeless 0  PHQ - 2 Score 0  Altered sleeping 0  Tired, decreased energy 0  Change in appetite 0  Feeling bad or failure about yourself  0  Trouble concentrating 0  Moving slowly or fidgety/restless 0  Suicidal thoughts 0  PHQ-9 Score 0      Review of Systems:   Pertinent items are noted in HPI Denies any headaches, blurred vision, fatigue, shortness of breath, chest pain, abdominal pain, bowel movements, urination, or intercourse unless otherwise stated above.  Pertinent History Reviewed:  Reviewed past medical,surgical, social and family history.  Reviewed problem list, medications and allergies. Physical Assessment:   Vitals:   02/22/21 1120  BP: (!) 175/93  Pulse: 76  Weight: 103 lb 6.4 oz (46.9 kg)  Height: 5' 3.5" (1.613 m)  Body mass index is 18.03 kg/m.        Physical Examination:   General appearance - well appearing, and in no distress  Mental status - alert, oriented to person, place, and time  Psych:  She has a normal mood and affect  Skin - warm and dry, normal color, no suspicious lesions noted  Chest - effort normal, all lung fields clear to auscultation bilaterally  Heart - normal rate and regular rhythm  Neck:  midline trachea, no thyromegaly or nodules  Breasts - breasts appear normal, no suspicious masses, no skin or nipple changes or  axillary nodes  Abdomen - soft, nontender, nondistended, no  masses or organomegaly  Pelvic - VULVA: normal appearing vulva with no masses, tenderness or lesions  VAGINA: normal appearing vagina with normal color and discharge, no lesions  CERVIX: normal appearing cervix without discharge or lesions, no CMT  Thin prep pap is done with HR HPV cotesting  UTERUS: uterus is felt to be normal size, shape, consistency and nontender   ADNEXA: No adnexal masses or tenderness noted.  Extremities:  No swelling or varicosities noted  Chaperone: Angel Neas     Assessment & Plan:  1) Well-Woman Exam -pap collected, reviewed guidelines    No orders of the defined types were placed in this encounter.   Meds: No orders of the defined types were placed in this encounter.   Follow-up: Return in about 1 year (around 02/22/2022) for Annual.   Myna Hidalgo, DO Attending Obstetrician & Gynecologist, [facility], [facility]

## 2021-02-26 LAB — CYTOLOGY - PAP
Comment: NEGATIVE
Diagnosis: NEGATIVE
Diagnosis: REACTIVE
High risk HPV: NEGATIVE

## 2021-04-08 ENCOUNTER — Other Ambulatory Visit (HOSPITAL_COMMUNITY): Payer: Self-pay | Admitting: Family Medicine

## 2021-04-08 DIAGNOSIS — R10824 Left lower quadrant rebound abdominal tenderness: Secondary | ICD-10-CM

## 2021-04-13 ENCOUNTER — Ambulatory Visit (HOSPITAL_COMMUNITY)
Admission: RE | Admit: 2021-04-13 | Discharge: 2021-04-13 | Disposition: A | Discharge status: Home / Self Care | Source: Ambulatory Visit | Attending: Family Medicine | Admitting: Family Medicine

## 2021-04-13 DIAGNOSIS — R10824 Left lower quadrant rebound abdominal tenderness: Secondary | ICD-10-CM | POA: Diagnosis not present

## 2022-01-30 ENCOUNTER — Encounter (HOSPITAL_COMMUNITY): Payer: Self-pay | Admitting: Emergency Medicine

## 2022-01-30 ENCOUNTER — Emergency Department (HOSPITAL_COMMUNITY)
Admission: EM | Admit: 2022-01-30 | Discharge: 2022-01-30 | Disposition: A | Discharge status: Home / Self Care | Attending: Emergency Medicine | Admitting: Emergency Medicine

## 2022-01-30 ENCOUNTER — Other Ambulatory Visit: Payer: Self-pay

## 2022-01-30 ENCOUNTER — Emergency Department (HOSPITAL_COMMUNITY)

## 2022-01-30 DIAGNOSIS — I10 Essential (primary) hypertension: Secondary | ICD-10-CM | POA: Insufficient documentation

## 2022-01-30 DIAGNOSIS — F10129 Alcohol abuse with intoxication, unspecified: Secondary | ICD-10-CM | POA: Diagnosis not present

## 2022-01-30 DIAGNOSIS — S01412A Laceration without foreign body of left cheek and temporomandibular area, initial encounter: Secondary | ICD-10-CM | POA: Insufficient documentation

## 2022-01-30 DIAGNOSIS — W01198A Fall on same level from slipping, tripping and stumbling with subsequent striking against other object, initial encounter: Secondary | ICD-10-CM | POA: Diagnosis not present

## 2022-01-30 DIAGNOSIS — F10929 Alcohol use, unspecified with intoxication, unspecified: Secondary | ICD-10-CM

## 2022-01-30 DIAGNOSIS — Y908 Blood alcohol level of 240 mg/100 ml or more: Secondary | ICD-10-CM | POA: Insufficient documentation

## 2022-01-30 DIAGNOSIS — S0993XA Unspecified injury of face, initial encounter: Secondary | ICD-10-CM | POA: Diagnosis present

## 2022-01-30 DIAGNOSIS — S0181XA Laceration without foreign body of other part of head, initial encounter: Secondary | ICD-10-CM

## 2022-01-30 LAB — COMPREHENSIVE METABOLIC PANEL
ALT: 27 U/L (ref 0–44)
AST: 60 U/L — ABNORMAL HIGH (ref 15–41)
Albumin: 4.3 g/dL (ref 3.5–5.0)
Alkaline Phosphatase: 74 U/L (ref 38–126)
Anion gap: 18 — ABNORMAL HIGH (ref 5–15)
BUN: 26 mg/dL — ABNORMAL HIGH (ref 6–20)
CO2: 21 mmol/L — ABNORMAL LOW (ref 22–32)
Calcium: 9.1 mg/dL (ref 8.9–10.3)
Chloride: 100 mmol/L (ref 98–111)
Creatinine, Ser: 0.75 mg/dL (ref 0.44–1.00)
GFR, Estimated: >60 mL/min (ref >60)
Glucose, Bld: 94 mg/dL (ref 70–99)
Potassium: 4.3 mmol/L (ref 3.5–5.1)
Sodium: 139 mmol/L (ref 135–145)
Total Bilirubin: 0.5 mg/dL (ref 0.3–1.2)
Total Protein: 7.9 g/dL (ref 6.5–8.1)

## 2022-01-30 LAB — CBC
HCT: 27.7 % — ABNORMAL LOW (ref 36.0–46.0)
Hemoglobin: 9.3 g/dL — ABNORMAL LOW (ref 12.0–15.0)
MCH: 29.9 pg (ref 26.0–34.0)
MCHC: 33.6 g/dL (ref 30.0–36.0)
MCV: 89.1 fL (ref 80.0–100.0)
Platelets: 282 10*3/uL (ref 150–400)
RBC: 3.11 MIL/uL — ABNORMAL LOW (ref 3.87–5.11)
RDW: 15.7 % — ABNORMAL HIGH (ref 11.5–15.5)
WBC: 5.9 10*3/uL (ref 4.0–10.5)
nRBC: 0.9 % — ABNORMAL HIGH (ref 0.0–0.2)

## 2022-01-30 LAB — ETHANOL: Alcohol, Ethyl (B): 496 mg/dL (ref <10)

## 2022-01-30 MED ORDER — FENTANYL CITRATE PF 50 MCG/ML IJ SOSY
50.0000 ug | PREFILLED_SYRINGE | INTRAMUSCULAR | Status: DC | PRN
  Administered 2022-01-30: 50 ug via INTRAVENOUS
  Filled 2022-01-30: qty 1

## 2022-01-30 MED ORDER — SODIUM CHLORIDE 0.9 % IV BOLUS
1000.0000 mL | Freq: Once | INTRAVENOUS | Status: AC
  Administered 2022-01-30: 1000 mL via INTRAVENOUS

## 2022-01-30 MED ORDER — LIDOCAINE HCL (PF) 1 % IJ SOLN
30.0000 mL | Freq: Once | INTRAMUSCULAR | Status: AC
  Administered 2022-01-30: 30 mL
  Filled 2022-01-30: qty 30

## 2022-01-30 MED ORDER — HALOPERIDOL LACTATE 5 MG/ML IJ SOLN
2.0000 mg | Freq: Once | INTRAMUSCULAR | Status: AC
  Administered 2022-01-30: 2 mg via INTRAVENOUS
  Filled 2022-01-30: qty 1

## 2022-01-30 MED ORDER — CEPHALEXIN 500 MG PO CAPS: 500.0000 mg | ORAL_CAPSULE | Freq: Three times a day (TID) | ORAL | 0 refills | Status: AC
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
## 2022-01-30 NOTE — ED Notes (Signed)
Pt called and stated her rx was not at pharmacy. Charge nurse called and pharmacist stated there was some confusion but pt has picked up her rx already. 01/30/22 @1450hrs 

## 2022-01-30 NOTE — Discharge Instructions (Signed)
You were evaluated in the Emergency Department and after careful evaluation, we did not find any emergent condition requiring admission or further testing in the hospital.  Your exam/testing today is overall reassuring.  CT scans did not show any internal injuries.  We repaired your laceration here in the emergency department.  Use the Keflex antibiotic to help prevent infection.  Your stitches will need to be removed in 5 to 7 days by healthcare professional.  You can follow-up with your primary care doctor if you are having concerns about how the scar is healing over the next few weeks.  Please return to the Emergency Department if you experience any worsening of your condition.   Thank you for allowing [place] to be a part of your care.

## 2022-01-30 NOTE — ED Notes (Signed)
Pt refusing medications, pt combative with staff.

## 2022-03-14 ENCOUNTER — Encounter: Payer: Self-pay | Admitting: Radiology

## 2022-03-19 ENCOUNTER — Other Ambulatory Visit (HOSPITAL_COMMUNITY): Payer: Self-pay | Admitting: Family Medicine

## 2022-03-19 DIAGNOSIS — Z1231 Encounter for screening mammogram for malignant neoplasm of breast: Secondary | ICD-10-CM

## 2022-03-25 ENCOUNTER — Encounter (HOSPITAL_COMMUNITY): Payer: Self-pay

## 2022-03-25 ENCOUNTER — Ambulatory Visit (HOSPITAL_COMMUNITY)
Admission: RE | Admit: 2022-03-25 | Discharge: 2022-03-25 | Disposition: A | Discharge status: Home / Self Care | Source: Ambulatory Visit | Attending: Family Medicine | Admitting: Family Medicine

## 2022-03-25 DIAGNOSIS — Z1231 Encounter for screening mammogram for malignant neoplasm of breast: Secondary | ICD-10-CM | POA: Diagnosis not present

## 2022-05-22 ENCOUNTER — Institutional Professional Consult (permissible substitution): Admitting: Plastic Surgery

## 2022-06-27 ENCOUNTER — Institutional Professional Consult (permissible substitution): Admitting: Plastic Surgery

## 2022-08-01 ENCOUNTER — Institutional Professional Consult (permissible substitution): Admitting: Plastic Surgery

## 2023-01-14 ENCOUNTER — Other Ambulatory Visit (HOSPITAL_COMMUNITY): Payer: Self-pay | Admitting: Family Medicine

## 2023-01-14 DIAGNOSIS — Z1231 Encounter for screening mammogram for malignant neoplasm of breast: Secondary | ICD-10-CM

## 2023-03-31 ENCOUNTER — Ambulatory Visit (HOSPITAL_COMMUNITY)
Admission: RE | Admit: 2023-03-31 | Discharge: 2023-03-31 | Disposition: A | Discharge status: Home / Self Care | Source: Ambulatory Visit | Attending: Family Medicine | Admitting: Family Medicine

## 2023-03-31 DIAGNOSIS — Z1231 Encounter for screening mammogram for malignant neoplasm of breast: Secondary | ICD-10-CM | POA: Diagnosis present

## 2023-06-12 ENCOUNTER — Emergency Department (HOSPITAL_COMMUNITY): Payer: Self-pay

## 2023-06-12 ENCOUNTER — Emergency Department (HOSPITAL_COMMUNITY)
Admission: EM | Admit: 2023-06-12 | Discharge: 2023-06-12 | Disposition: A | Discharge status: Home / Self Care | Payer: Self-pay | Attending: Emergency Medicine | Admitting: Emergency Medicine

## 2023-06-12 ENCOUNTER — Other Ambulatory Visit: Payer: Self-pay

## 2023-06-12 ENCOUNTER — Encounter (HOSPITAL_COMMUNITY): Payer: Self-pay

## 2023-06-12 DIAGNOSIS — Y92002 Bathroom of unspecified non-institutional (private) residence single-family (private) house as the place of occurrence of the external cause: Secondary | ICD-10-CM | POA: Insufficient documentation

## 2023-06-12 DIAGNOSIS — S0990XA Unspecified injury of head, initial encounter: Secondary | ICD-10-CM

## 2023-06-12 DIAGNOSIS — Y908 Blood alcohol level of 240 mg/100 ml or more: Secondary | ICD-10-CM | POA: Insufficient documentation

## 2023-06-12 DIAGNOSIS — E8729 Other acidosis: Secondary | ICD-10-CM

## 2023-06-12 DIAGNOSIS — F10120 Alcohol abuse with intoxication, uncomplicated: Secondary | ICD-10-CM | POA: Insufficient documentation

## 2023-06-12 DIAGNOSIS — W19XXXA Unspecified fall, initial encounter: Secondary | ICD-10-CM

## 2023-06-12 DIAGNOSIS — W01198A Fall on same level from slipping, tripping and stumbling with subsequent striking against other object, initial encounter: Secondary | ICD-10-CM | POA: Insufficient documentation

## 2023-06-12 DIAGNOSIS — E872 Acidosis, unspecified: Secondary | ICD-10-CM | POA: Insufficient documentation

## 2023-06-12 DIAGNOSIS — F10929 Alcohol use, unspecified with intoxication, unspecified: Secondary | ICD-10-CM

## 2023-06-12 DIAGNOSIS — S0003XA Contusion of scalp, initial encounter: Secondary | ICD-10-CM | POA: Insufficient documentation

## 2023-06-12 LAB — CBC WITH DIFFERENTIAL/PLATELET
Abs Immature Granulocytes: 0.01 10*3/uL (ref 0.00–0.07)
Basophils Absolute: 0.1 10*3/uL (ref 0.0–0.1)
Basophils Relative: 1 %
Eosinophils Absolute: 0.1 10*3/uL (ref 0.0–0.5)
Eosinophils Relative: 1 %
HCT: 29.9 % — ABNORMAL LOW (ref 36.0–46.0)
Hemoglobin: 10.1 g/dL — ABNORMAL LOW (ref 12.0–15.0)
Immature Granulocytes: 0 %
Lymphocytes Relative: 48 %
Lymphs Abs: 3 10*3/uL (ref 0.7–4.0)
MCH: 30.1 pg (ref 26.0–34.0)
MCHC: 33.8 g/dL (ref 30.0–36.0)
MCV: 89 fL (ref 80.0–100.0)
Monocytes Absolute: 0.4 10*3/uL (ref 0.1–1.0)
Monocytes Relative: 6 %
Neutro Abs: 2.8 10*3/uL (ref 1.7–7.7)
Neutrophils Relative %: 44 %
Platelets: 261 10*3/uL (ref 150–400)
RBC: 3.36 MIL/uL — ABNORMAL LOW (ref 3.87–5.11)
RDW: 14.1 % (ref 11.5–15.5)
WBC: 6.3 10*3/uL (ref 4.0–10.5)
nRBC: 0 % (ref 0.0–0.2)

## 2023-06-12 LAB — COMPREHENSIVE METABOLIC PANEL WITH GFR
ALT: 118 U/L — ABNORMAL HIGH (ref 0–44)
AST: 469 U/L — ABNORMAL HIGH (ref 15–41)
Albumin: 4.3 g/dL (ref 3.5–5.0)
Alkaline Phosphatase: 124 U/L (ref 38–126)
Anion gap: 22 — ABNORMAL HIGH (ref 5–15)
BUN: 21 mg/dL — ABNORMAL HIGH (ref 6–20)
CO2: 13 mmol/L — ABNORMAL LOW (ref 22–32)
Calcium: 8.9 mg/dL (ref 8.9–10.3)
Chloride: 99 mmol/L (ref 98–111)
Creatinine, Ser: 0.96 mg/dL (ref 0.44–1.00)
GFR, Estimated: >60 mL/min (ref >60)
Glucose, Bld: 85 mg/dL (ref 70–99)
Potassium: 4.2 mmol/L (ref 3.5–5.1)
Sodium: 134 mmol/L — ABNORMAL LOW (ref 135–145)
Total Bilirubin: 0.7 mg/dL (ref 0.0–1.2)
Total Protein: 8.1 g/dL (ref 6.5–8.1)

## 2023-06-12 LAB — CK: Total CK: 150 U/L (ref 38–234)

## 2023-06-12 LAB — ETHANOL: Alcohol, Ethyl (B): 461 mg/dL (ref <15)

## 2023-06-12 MED ORDER — DIPHENHYDRAMINE HCL 50 MG/ML IJ SOLN
25.0000 mg | Freq: Once | INTRAMUSCULAR | Status: AC
  Administered 2023-06-12: 25 mg via INTRAMUSCULAR
  Filled 2023-06-12: qty 1

## 2023-06-12 MED ORDER — SODIUM CHLORIDE 0.9 % IV BOLUS: 1000.0000 mL | Freq: Once | INTRAVENOUS | Status: DC

## 2023-06-12 MED ORDER — HALOPERIDOL LACTATE 5 MG/ML IJ SOLN
5.0000 mg | Freq: Once | INTRAMUSCULAR | Status: AC
  Administered 2023-06-12: 5 mg via INTRAMUSCULAR
  Filled 2023-06-12: qty 1

## 2023-06-12 NOTE — ED Notes (Signed)
 Pt seen leaving room and headed towards lobby exit. Staff called out to pt. Pt ignored staff and continued to walk towards the exit. This nurse and the charge nurse followed her to the lobby where the pt was stopped by family and was told she needs to stay. Family asked if they could come back with the pt. As of right now no visitors allowed. Security and this nurse escorted the pt back to her room.

## 2023-06-12 NOTE — ED Notes (Signed)
 Pt taken to CT Pt initially refused but after explaining IVC and what that meant, Pt cussed at us , but slid over to CT table and allowed scan to be done. Pt returned to room, and RN notified that PT wanted family updated, as she believes they are in the lobby waiting for her to be discharged.

## 2023-06-12 NOTE — ED Notes (Signed)
 Pt removed IV and stated that she wants to go home. MD notified and was at bedside explaining what was going on. MD ordered IM meds for pt. Pt given phone to call family and see where her purse is. MD starting IVC paperwork so pt can get the care she needs before she leaves. Pt still refusing scans.

## 2023-06-12 NOTE — ED Notes (Signed)
 Family in lobby updated on status. Pt aware and okay with this.

## 2023-06-12 NOTE — Discharge Instructions (Signed)
 You were seen in the emergency department for a head injury.  You did not have a traumatic brain injury.  Please follow-up with your primary doctor in several days regarding your symptoms.

## 2023-06-12 NOTE — ED Notes (Signed)
Pt refused to go to CT. 
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
## 2023-06-25 ENCOUNTER — Emergency Department (HOSPITAL_COMMUNITY)
Admission: EM | Admit: 2023-06-25 | Discharge: 2023-06-25 | Disposition: A | Discharge status: Home / Self Care | Payer: Self-pay | Attending: Emergency Medicine | Admitting: Emergency Medicine

## 2023-06-25 ENCOUNTER — Encounter (HOSPITAL_COMMUNITY): Payer: Self-pay

## 2023-06-25 ENCOUNTER — Other Ambulatory Visit: Payer: Self-pay

## 2023-06-25 DIAGNOSIS — T148XXA Other injury of unspecified body region, initial encounter: Secondary | ICD-10-CM

## 2023-06-25 DIAGNOSIS — W1809XA Striking against other object with subsequent fall, initial encounter: Secondary | ICD-10-CM | POA: Insufficient documentation

## 2023-06-25 DIAGNOSIS — S0003XA Contusion of scalp, initial encounter: Secondary | ICD-10-CM | POA: Insufficient documentation
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
## 2023-06-25 NOTE — ED Provider Notes (Signed)
 [place] EMERGENCY DEPARTMENT AT [facility] Provider Note   CSN: [id number] Arrival date & time: 06/25/23  4098     History  Chief Complaint  Patient presents with   Carla Suarez is a 58 y.o. female presenting to ED for recheck after a fall for which she was seen in the ED about 2 weeks ago.  She had hit her head at that time.  She had a CT scan done that was unremarkable but did note to have a hematoma in her left scalp.  She presents back to ED complaining of continued soft and tender nodule on the left side of her scalp.  She is not on blood thinners.  HPI     Home Medications Prior to Admission medications   Medication Sig Start Date End Date Taking? Authorizing Provider  lisinopril (ZESTRIL) 10 MG tablet Take 10 mg by mouth daily. Patient not taking: Reported on 02/22/2021 04/06/19   [provider]  meloxicam  (MOBIC ) 7.5 MG tablet Take 1 tablet (7.5 mg total) by mouth daily. Patient not taking: Reported on 02/22/2021 06/01/19   Darrin Emerald, MD  simvastatin (ZOCOR) 20 MG tablet Take 1 tablet by mouth daily. 04/21/23   [provider]      Allergies    Oregano [origanum oil]    Review of Systems   Review of Systems  Physical Exam Updated Vital Signs BP (!) 141/100 (BP Location: Right Arm)   Pulse 81   Temp 97.7 F (36.5 C) (Oral)   Resp 16   Ht 5' 3 (1.6 m)   Wt 47.6 kg   SpO2 100%   BMI 18.59 kg/m  Physical Exam Constitutional:      General: She is not in acute distress. HENT:     Head: Normocephalic. No raccoon eyes or Battle's sign.     Comments: The patient has a tender and mildly boggy hematoma on the left parietal scalp  Eyes:     Conjunctiva/sclera: Conjunctivae normal.     Pupils: Pupils are equal, round, and reactive to light.  Cardiovascular:     Rate and Rhythm: Normal rate and regular rhythm.  Pulmonary:     Effort: Pulmonary effort is normal. No respiratory distress.  Abdominal:      General: There is no distension.     Tenderness: There is no abdominal tenderness.  Skin:    General: Skin is warm and dry.  Neurological:     General: No focal deficit present.     Mental Status: She is alert. Mental status is at baseline.  Psychiatric:        Mood and Affect: Mood normal.        Behavior: Behavior normal.     ED Results / Procedures / Treatments   Labs (all labs ordered are listed, but only abnormal results are displayed) Labs Reviewed - No data to display  EKG None  Radiology No results found.  Procedures Procedures    Medications Ordered in ED Medications - No data to display  ED Course/ Medical Decision Making/ A&P                                 Medical Decision Making  Patient is presenting to ED with a posttraumatic hematoma.  I do not see evidence of recurring skull injury per history or exam.  No indication for repeat CT imaging.  Doubt venous  thrombosis.  Explained that this type of bleeding is to be expected after a superficial head injury.  She was noted to have a hematoma on her scalp even 2 weeks ago when she was in the ED.  This can take several weeks to resolve.  I recommended ice and compression at home.  She verbalized understanding.        Final Clinical Impression(s) / ED Diagnoses Final diagnoses:  Hematoma    Rx / DC Orders ED Discharge Orders     None         Yu Peggs, Janalyn Me, MD 06/25/23 [phone]

## 2023-10-09 ENCOUNTER — Other Ambulatory Visit: Payer: Self-pay

## 2023-10-09 ENCOUNTER — Encounter: Payer: Self-pay | Admitting: Physician Assistant

## 2023-10-09 ENCOUNTER — Ambulatory Visit: Payer: Self-pay | Admitting: Physician Assistant

## 2023-10-09 DIAGNOSIS — M25562 Pain in left knee: Secondary | ICD-10-CM

## 2023-10-09 DIAGNOSIS — G8929 Other chronic pain: Secondary | ICD-10-CM

## 2023-10-09 MED ORDER — LIDOCAINE HCL 1 % IJ SOLN
2.0000 mL | INTRAMUSCULAR | Status: AC | PRN
  Administered 2023-10-09: 2 mL

## 2023-10-09 MED ORDER — METHYLPREDNISOLONE ACETATE 40 MG/ML IJ SUSP
40.0000 mg | INTRAMUSCULAR | Status: AC | PRN
  Administered 2023-10-09: 40 mg via INTRA_ARTICULAR

## 2023-10-09 MED ORDER — BUPIVACAINE HCL 0.25 % IJ SOLN
2.0000 mL | INTRAMUSCULAR | Status: AC | PRN
  Administered 2023-10-09: 2 mL via INTRA_ARTICULAR
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
## 2023-10-27 ENCOUNTER — Telehealth: Payer: Self-pay | Admitting: Physician Assistant

## 2023-10-27 NOTE — Telephone Encounter (Signed)
 I called the patient to get some more details. After the cortisone injection she received on 10/09/23, the patient had 1-2 days of relief. She gave it more time to see if it would get better, along with doing her exercises per instructions at that last visit. The pain is the same as it was before the injection. She says she has been elevating the leg, but has not been icing the knee or wrapping the knee for compression -- she states she does not like anything cold, nor anything tight on her body. Takes a couple Tylenol  prn severe pain. Minimal relief. The knee continues to pop/crack and it feels unstable with going down/up steps/stairs. She is ambulating with a cane prn. Her appt with PT is not until 11/06/23 at [facility] -- the patient said she is going to contact them after this call, to see if she can be put on a waiting list.   Please advise.

## 2023-10-27 NOTE — Telephone Encounter (Signed)
 Pt called stating she had an injection in her knee and it is still hurting and asking for a call from [facility]. Pt phone number is [phone]

## 2023-10-28 NOTE — Telephone Encounter (Signed)
 I called and advised the patient. She did get her PT appt moved up to this coming Monday, 11/03/23. In the meantime, she will continue with the exercises she already has and Tylenol  if needed. I did advise that since she cannot tolerate cold, that maybe she could try heat on the knee instead, provided there is no swelling in the knee (for which ice would be the better option). Continue elevation of the leg prn and continue current precautions with ambulating, particularly with stairs. The patient voiced understanding of this, and that she should give us  a call if conditions worsen, or are not getting better with PT.
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
## 2023-11-03 ENCOUNTER — Ambulatory Visit (HOSPITAL_COMMUNITY): Payer: Self-pay | Attending: Physician Assistant

## 2023-11-03 ENCOUNTER — Other Ambulatory Visit: Payer: Self-pay

## 2023-11-03 ENCOUNTER — Encounter (HOSPITAL_COMMUNITY): Payer: Self-pay

## 2023-11-03 DIAGNOSIS — R29898 Other symptoms and signs involving the musculoskeletal system: Secondary | ICD-10-CM | POA: Insufficient documentation

## 2023-11-03 DIAGNOSIS — G8929 Other chronic pain: Secondary | ICD-10-CM | POA: Insufficient documentation

## 2023-11-03 DIAGNOSIS — Z7409 Other reduced mobility: Secondary | ICD-10-CM | POA: Diagnosis present

## 2023-11-03 DIAGNOSIS — M25562 Pain in left knee: Secondary | ICD-10-CM | POA: Insufficient documentation

## 2023-11-06 ENCOUNTER — Ambulatory Visit (HOSPITAL_COMMUNITY): Payer: Self-pay

## 2023-11-12 ENCOUNTER — Ambulatory Visit (HOSPITAL_COMMUNITY): Admitting: Physical Therapy

## 2023-11-12 DIAGNOSIS — R29898 Other symptoms and signs involving the musculoskeletal system: Secondary | ICD-10-CM

## 2023-11-12 DIAGNOSIS — M25562 Pain in left knee: Secondary | ICD-10-CM | POA: Diagnosis not present

## 2023-11-12 DIAGNOSIS — G8929 Other chronic pain: Secondary | ICD-10-CM

## 2023-11-12 DIAGNOSIS — Z7409 Other reduced mobility: Secondary | ICD-10-CM
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
## 2023-11-12 NOTE — Therapy (Signed)
 PHYSICAL THERAPY DISCHARGE SUMMARY  Visits from Start of Care: 2  Current functional level related to goals / functional outcomes: See last visit on 11/12/23   Remaining deficits: See last visit on 11/12/23   Education / Equipment: HEP   Patient agrees to discharge. Patient goals were met. Patient is being discharged due to being pleased with the current functional level.  4:03 PM, 11/12/23 Rosaria Settler, PT, DPT [facility] [place]

## 2023-11-17 ENCOUNTER — Encounter: Payer: Self-pay | Admitting: Radiology

## 2023-11-25 ENCOUNTER — Ambulatory Visit (HOSPITAL_COMMUNITY)

## 2023-12-02 ENCOUNTER — Ambulatory Visit (HOSPITAL_COMMUNITY)

## 2023-12-04 ENCOUNTER — Ambulatory Visit (HOSPITAL_COMMUNITY)

## 2023-12-09 ENCOUNTER — Ambulatory Visit (HOSPITAL_COMMUNITY): Admitting: Physical Therapy

## 2023-12-15 ENCOUNTER — Ambulatory Visit (HOSPITAL_COMMUNITY)

## 2023-12-17 ENCOUNTER — Ambulatory Visit (HOSPITAL_COMMUNITY)

## 2023-12-23 ENCOUNTER — Ambulatory Visit (HOSPITAL_COMMUNITY): Admitting: Physical Therapy

## 2023-12-25 ENCOUNTER — Ambulatory Visit (HOSPITAL_COMMUNITY)

## 2023-12-30 ENCOUNTER — Ambulatory Visit (HOSPITAL_COMMUNITY)

## 2024-01-01 ENCOUNTER — Ambulatory Visit (HOSPITAL_COMMUNITY)

## 2024-01-06 ENCOUNTER — Ambulatory Visit (HOSPITAL_COMMUNITY)

## 2024-01-12 ENCOUNTER — Ambulatory Visit (HOSPITAL_COMMUNITY)
